# Patient Record
Sex: Female | Born: 1985
Health system: Southern US, Community
[De-identification: ages and names within clinical notes are randomized; demographics above are authoritative.]

## PROBLEM LIST (undated history)

## (undated) ENCOUNTER — Inpatient Hospital Stay (HOSPITAL_COMMUNITY): Payer: Self-pay

## (undated) DIAGNOSIS — O24419 Gestational diabetes mellitus in pregnancy, unspecified control: Secondary | ICD-10-CM

## (undated) DIAGNOSIS — O139 Gestational [pregnancy-induced] hypertension without significant proteinuria, unspecified trimester: Secondary | ICD-10-CM

## (undated) DIAGNOSIS — R87629 Unspecified abnormal cytological findings in specimens from vagina: Secondary | ICD-10-CM

## (undated) HISTORY — DX: Unspecified abnormal cytological findings in specimens from vagina: R87.629

## (undated) HISTORY — PX: NO PAST SURGERIES: SHX2092

---
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
## 2006-08-09 ENCOUNTER — Emergency Department (HOSPITAL_COMMUNITY): Admission: EM | Admit: 2006-08-09 | Discharge: 2006-08-09 | Payer: Self-pay | Admitting: *Deleted

## 2007-06-26 ENCOUNTER — Emergency Department (HOSPITAL_COMMUNITY): Admission: EM | Admit: 2007-06-26 | Discharge: 2007-06-26 | Payer: Self-pay | Admitting: Emergency Medicine

## 2007-09-18 ENCOUNTER — Emergency Department (HOSPITAL_COMMUNITY): Admission: EM | Admit: 2007-09-18 | Discharge: 2007-09-19 | Payer: Self-pay | Admitting: Emergency Medicine

## 2008-11-26 ENCOUNTER — Emergency Department (HOSPITAL_COMMUNITY): Admission: EM | Admit: 2008-11-26 | Discharge: 2008-11-26 | Payer: Self-pay | Admitting: Emergency Medicine

## 2009-04-13 ENCOUNTER — Emergency Department (HOSPITAL_COMMUNITY): Admission: EM | Admit: 2009-04-13 | Discharge: 2009-04-13 | Payer: Self-pay | Admitting: Emergency Medicine

## 2010-06-14 LAB — CBC
Hemoglobin: 12.9 g/dL (ref 12.0–15.0)
MCHC: 34.6 g/dL (ref 30.0–36.0)
MCV: 86.7 fL (ref 78.0–100.0)
RBC: 4.31 MIL/uL (ref 3.87–5.11)
RDW: 12.7 % (ref 11.5–15.5)

## 2010-06-14 LAB — URINALYSIS, ROUTINE W REFLEX MICROSCOPIC
Glucose, UA: NEGATIVE mg/dL
Ketones, ur: NEGATIVE mg/dL
Protein, ur: NEGATIVE mg/dL

## 2010-06-14 LAB — URINE MICROSCOPIC-ADD ON

## 2010-06-14 LAB — BASIC METABOLIC PANEL
Creatinine, Ser: 0.66 mg/dL (ref 0.4–1.2)
GFR calc non Af Amer: >60 mL/min (ref >60)
Potassium: 3.3 mEq/L — ABNORMAL LOW (ref 3.5–5.1)
Sodium: 140 mEq/L (ref 135–145)

## 2010-06-14 LAB — DIFFERENTIAL
Basophils Absolute: 0 10*3/uL (ref 0.0–0.1)
Lymphs Abs: 1 10*3/uL (ref 0.7–4.0)
Monocytes Absolute: 0.2 10*3/uL (ref 0.1–1.0)
Monocytes Relative: 4 % (ref 3–12)

## 2010-10-09 ENCOUNTER — Emergency Department (HOSPITAL_COMMUNITY)
Admission: EM | Admit: 2010-10-09 | Discharge: 2010-10-09 | Disposition: A | Discharge status: Home / Self Care | Payer: Self-pay | Attending: Emergency Medicine | Admitting: Emergency Medicine

## 2010-10-09 DIAGNOSIS — R1013 Epigastric pain: Secondary | ICD-10-CM | POA: Insufficient documentation

## 2010-10-09 DIAGNOSIS — R112 Nausea with vomiting, unspecified: Secondary | ICD-10-CM | POA: Insufficient documentation

## 2010-10-09 DIAGNOSIS — K5289 Other specified noninfective gastroenteritis and colitis: Secondary | ICD-10-CM | POA: Insufficient documentation

## 2010-10-09 DIAGNOSIS — R197 Diarrhea, unspecified: Secondary | ICD-10-CM | POA: Insufficient documentation

## 2010-10-09 DIAGNOSIS — R630 Anorexia: Secondary | ICD-10-CM | POA: Insufficient documentation

## 2010-10-09 LAB — POCT I-STAT, CHEM 8
Calcium, Ion: 1.21 mmol/L (ref 1.12–1.32)
Chloride: 106 mEq/L (ref 96–112)
Hemoglobin: 15 g/dL (ref 12.0–15.0)

## 2010-10-09 LAB — POCT PREGNANCY, URINE: Preg Test, Ur: NEGATIVE

## 2010-12-21 LAB — DIFFERENTIAL
Basophils Relative: 0
Eosinophils Absolute: 0
Eosinophils Relative: 0
Monocytes Absolute: 0.8

## 2010-12-21 LAB — POCT I-STAT, CHEM 8
BUN: 12
Creatinine, Ser: 1.1
Potassium: 3.1 — ABNORMAL LOW

## 2010-12-21 LAB — URINE CULTURE

## 2010-12-21 LAB — CBC
Hemoglobin: 12.4
MCHC: 34.7
MCV: 83.6
RBC: 4.28
RDW: 12.9
WBC: 9.7

## 2010-12-21 LAB — URINALYSIS, ROUTINE W REFLEX MICROSCOPIC
Bilirubin Urine: NEGATIVE
Ketones, ur: 15 — AB
Protein, ur: 100 — AB
Specific Gravity, Urine: 1.023
Urobilinogen, UA: 1
pH: 6

## 2010-12-21 LAB — URINE MICROSCOPIC-ADD ON

## 2010-12-24 LAB — POCT I-STAT, CHEM 8
Calcium, Ion: 1.18
Glucose, Bld: 84
HCT: 40
Hemoglobin: 13.6
Potassium: 3.4 — ABNORMAL LOW
Sodium: 137

## 2010-12-24 LAB — RAPID STREP SCREEN (MED CTR MEBANE ONLY): Streptococcus, Group A Screen (Direct): NEGATIVE

## 2010-12-24 LAB — URINALYSIS, ROUTINE W REFLEX MICROSCOPIC
Glucose, UA: NEGATIVE
Nitrite: NEGATIVE
Specific Gravity, Urine: 1.022
Urobilinogen, UA: 1

## 2010-12-24 LAB — DIFFERENTIAL
Basophils Absolute: 0
Basophils Relative: 0
Lymphocytes Relative: 8 — ABNORMAL LOW

## 2010-12-24 LAB — CBC
HCT: 38.6
MCHC: 33.9
MCV: 85.5
Platelets: 194
RBC: 4.52
RDW: 12.8
WBC: 6.9

## 2012-02-03 ENCOUNTER — Emergency Department (HOSPITAL_COMMUNITY)
Admission: EM | Admit: 2012-02-03 | Discharge: 2012-02-03 | Disposition: A | Discharge status: Home / Self Care | Payer: Medicaid Other | Attending: Emergency Medicine | Admitting: Emergency Medicine

## 2012-02-03 ENCOUNTER — Encounter (HOSPITAL_COMMUNITY): Payer: Self-pay | Admitting: Emergency Medicine

## 2012-02-03 DIAGNOSIS — IMO0001 Reserved for inherently not codable concepts without codable children: Secondary | ICD-10-CM | POA: Insufficient documentation

## 2012-02-03 DIAGNOSIS — J069 Acute upper respiratory infection, unspecified: Secondary | ICD-10-CM | POA: Insufficient documentation

## 2012-02-03 DIAGNOSIS — R110 Nausea: Secondary | ICD-10-CM | POA: Insufficient documentation

## 2012-02-03 DIAGNOSIS — R05 Cough: Secondary | ICD-10-CM | POA: Insufficient documentation

## 2012-02-03 DIAGNOSIS — R509 Fever, unspecified: Secondary | ICD-10-CM | POA: Insufficient documentation

## 2012-02-03 DIAGNOSIS — R059 Cough, unspecified: Secondary | ICD-10-CM | POA: Insufficient documentation

## 2012-02-03 DIAGNOSIS — J3489 Other specified disorders of nose and nasal sinuses: Secondary | ICD-10-CM | POA: Insufficient documentation

## 2012-02-03 MED ORDER — ACETAMINOPHEN 325 MG PO TABS
650.0000 mg | ORAL_TABLET | Freq: Once | ORAL | Status: AC
  Administered 2012-02-03: 650 mg via ORAL
  Filled 2012-02-03: qty 2

## 2012-02-03 NOTE — ED Notes (Signed)
Pt c/o sore throat and fever x 2 days; pt sts chills and body aches

## 2012-02-03 NOTE — ED Provider Notes (Signed)
History   This chart was scribed for Olivia Quarry, MD by Gerlean Ren. This patient was seen in room TR08C/TR08C and the patient's care was started at 2:01 PM    CSN: [id number]  Arrival date & time 02/03/12  1313   None     Chief Complaint  Patient presents with  . Sore Throat  . Fever    The history is provided by the patient. No language interpreter was used.   Olivia Park is a 26 y.o. female who presents to the Emergency Department complaining of 2 days of sore throat with associated HA, fever, chills, productive cough, nausea, rhinorrhea and myalgias.  Pt denies known sick contacts.  Pt denies emesis, abdominal pain, and rash.  Pt has used Dayquil with no improvements.  Pt has no h/o chronic medical conditions.  Pt denies tobacco use but reports occasional alcohol use.   History reviewed. No pertinent past medical history.  History reviewed. No pertinent past surgical history.  History reviewed. No pertinent family history.  History  Substance Use Topics  . Smoking status: Never Smoker   . Smokeless tobacco: Not on file  . Alcohol Use: Yes     Comment: occ    No OB history provided.  Review of Systems  Constitutional: Positive for fever and chills.  HENT: Positive for sore throat.   Musculoskeletal: Positive for myalgias.    Allergies  Review of patient's allergies indicates no known allergies.  Home Medications  No current outpatient prescriptions on file.  BP 136/90  Pulse 122  Temp 100.7 F (38.2 C) (Oral)  Resp 18  SpO2 95%  Physical Exam  Vitals reviewed. Constitutional: She is oriented to person, place, and time. She appears well-developed.  HENT:  Head: Normocephalic and atraumatic.       Pharynx mildly erythematous.  No exudate or swelling.  Eyes: Conjunctivae normal and EOM are normal. Pupils are equal, round, and reactive to light.  Neck: Normal range of motion. No tracheal deviation present.  Cardiovascular: Normal rate, regular  rhythm and normal heart sounds.   Pulmonary/Chest: Effort normal and breath sounds normal.  Musculoskeletal: Normal range of motion. She exhibits no edema.  Neurological: She is alert and oriented to person, place, and time.  Skin: Skin is warm and dry.  Psychiatric: She has a normal mood and affect.    ED Course  Procedures (including critical care time) DIAGNOSTIC STUDIES: Oxygen Saturation is 95% on room air, adequate by my interpretation.    COORDINATION OF CARE: 2:05 PM- Patient informed of clinical course, understands medical decision-making process, and agrees with plan.  Discussed getting a work note for 2 days.  Ordered PO tylenol.      Labs Reviewed - No data to display No results found.   No diagnosis found.   I personally performed the services described in this documentation, which was scribed in my presence. The recorded information has been reviewed and is accurate.   MDM  Patient with presentation of viral pharyngitits with sore throat, body, aches, rhinorrhea , cough.   Initiial vs hr 122, normal on my exam.        Olivia Quarry, MD 02/06/12 [phone]

## 2012-05-18 ENCOUNTER — Emergency Department (HOSPITAL_COMMUNITY)
Admission: EM | Admit: 2012-05-18 | Discharge: 2012-05-18 | Discharge status: Against Medical Advice | Payer: Medicaid Other | Attending: Emergency Medicine | Admitting: Emergency Medicine

## 2012-05-18 ENCOUNTER — Encounter (HOSPITAL_COMMUNITY): Payer: Self-pay | Admitting: Cardiology

## 2012-05-18 DIAGNOSIS — R112 Nausea with vomiting, unspecified: Secondary | ICD-10-CM | POA: Insufficient documentation

## 2012-05-18 DIAGNOSIS — R42 Dizziness and giddiness: Secondary | ICD-10-CM | POA: Insufficient documentation

## 2012-05-18 DIAGNOSIS — Z3202 Encounter for pregnancy test, result negative: Secondary | ICD-10-CM | POA: Insufficient documentation

## 2012-05-18 LAB — URINALYSIS, ROUTINE W REFLEX MICROSCOPIC
Bilirubin Urine: NEGATIVE
Hgb urine dipstick: NEGATIVE
Nitrite: NEGATIVE
Protein, ur: NEGATIVE mg/dL
Specific Gravity, Urine: 1.021 (ref 1.005–1.030)
Urobilinogen, UA: 0.2 mg/dL (ref 0.0–1.0)

## 2012-05-18 LAB — URINE MICROSCOPIC-ADD ON

## 2012-05-18 NOTE — ED Notes (Signed)
No anawer

## 2012-05-18 NOTE — ED Notes (Signed)
Pt reports this morning she had an episode of n/v and has felt dizzy all day. Reports she has not been able to keep anything down. Denies any vision changes or sensitivity to light. Denies any urinary symptoms.

## 2012-05-18 NOTE — ED Notes (Signed)
No answer when called to a room x 2

## 2013-02-24 ENCOUNTER — Emergency Department (HOSPITAL_COMMUNITY)
Admission: EM | Admit: 2013-02-24 | Discharge: 2013-02-24 | Disposition: A | Discharge status: Home / Self Care | Payer: Medicaid Other | Attending: Emergency Medicine | Admitting: Emergency Medicine

## 2013-02-24 ENCOUNTER — Encounter (HOSPITAL_COMMUNITY): Payer: Self-pay | Admitting: Emergency Medicine

## 2013-02-24 DIAGNOSIS — X500XXA Overexertion from strenuous movement or load, initial encounter: Secondary | ICD-10-CM | POA: Insufficient documentation

## 2013-02-24 DIAGNOSIS — S161XXA Strain of muscle, fascia and tendon at neck level, initial encounter: Secondary | ICD-10-CM

## 2013-02-24 DIAGNOSIS — S139XXA Sprain of joints and ligaments of unspecified parts of neck, initial encounter: Secondary | ICD-10-CM | POA: Insufficient documentation

## 2013-02-24 DIAGNOSIS — Y9389 Activity, other specified: Secondary | ICD-10-CM | POA: Insufficient documentation

## 2013-02-24 DIAGNOSIS — Y92009 Unspecified place in unspecified non-institutional (private) residence as the place of occurrence of the external cause: Secondary | ICD-10-CM | POA: Insufficient documentation

## 2013-02-24 MED ORDER — OXYCODONE-ACETAMINOPHEN 5-325 MG PO TABS: 1.0000 | ORAL_TABLET | ORAL | Status: DC | PRN

## 2013-02-24 MED ORDER — DIAZEPAM 5 MG PO TABS
5.0000 mg | ORAL_TABLET | Freq: Once | ORAL | Status: AC
  Administered 2013-02-24: 5 mg via ORAL
  Filled 2013-02-24: qty 1

## 2013-02-24 MED ORDER — DIAZEPAM 5 MG PO TABS: 5.0000 mg | ORAL_TABLET | Freq: Three times a day (TID) | ORAL | Status: DC | PRN

## 2013-02-24 MED ORDER — KETOROLAC TROMETHAMINE 30 MG/ML IJ SOLN
30.0000 mg | Freq: Once | INTRAMUSCULAR | Status: AC
  Administered 2013-02-24: 30 mg via INTRAMUSCULAR
  Filled 2013-02-24: qty 1

## 2013-02-24 MED ORDER — OXYCODONE-ACETAMINOPHEN 5-325 MG PO TABS
2.0000 | ORAL_TABLET | Freq: Once | ORAL | Status: AC
  Administered 2013-02-24: 2 via ORAL
  Filled 2013-02-24: qty 2

## 2013-02-24 MED ORDER — NAPROXEN 375 MG PO TABS: 375.0000 mg | ORAL_TABLET | Freq: Three times a day (TID) | ORAL | Status: DC

## 2013-02-24 NOTE — ED Notes (Signed)
PT reports a sudden onset of Rt sided neck pain . No injuries and no hx of surgery to neck. Pain 10/10. Movement increases pain.

## 2013-02-24 NOTE — ED Provider Notes (Signed)
CSN: [id number]     Arrival date & time 02/24/13  0841 History   First MD Initiated Contact with Patient 02/24/13 [phone]     Chief Complaint  Patient presents with  . Neck Pain   (Consider location/radiation/quality/duration/timing/severity/associated sxs/prior Treatment) HPI  27 year old female with neck pain. Patient first noticed this morning after turning over in bed. She is not specifically sure if she woke up with the pain or if it developed as she turned. Pain is in the right lateral/posterior neck. Has been constant since onset. It is worse with palpation and with turning her head. She tried taking some ibuprofen before arrival with minimal relief. No fevers or chills. No nausea or vomiting. No visual complaints. No numbness or tingling. No dizziness, lightheadedness or vertiginous symptoms. No tinnitus or change in her hearing. Denies any difficulty ambulating or problems with coordination. Pt is otherwise healthy.    History reviewed. No pertinent past medical history. History reviewed. No pertinent past surgical history. No family history on file. History  Substance Use Topics  . Smoking status: Never Smoker   . Smokeless tobacco: Not on file  . Alcohol Use: No   OB History   Grav Para Term Preterm Abortions TAB SAB Ect Mult Living                 Review of Systems  All systems reviewed and negative, other than as noted in HPI.   Allergies  Review of patient's allergies indicates no known allergies.  Home Medications   Current Outpatient Rx  Name  Route  Sig  Dispense  Refill  . ibuprofen (ADVIL,MOTRIN) 200 MG tablet   Oral   Take 200 mg by mouth every 6 (six) hours as needed.         . diazepam (VALIUM) 5 MG tablet   Oral   Take 1 tablet (5 mg total) by mouth every 8 (eight) hours as needed for muscle spasms.   12 tablet   0   . naproxen (NAPROSYN) 375 MG tablet   Oral   Take 1 tablet (375 mg total) by mouth 3 (three) times daily with meals.   20  tablet   0   . oxyCODONE-acetaminophen (PERCOCET/ROXICET) 5-325 MG per tablet   Oral   Take 1 tablet by mouth every 4 (four) hours as needed for severe pain.   12 tablet   0    BP 123/76  Pulse 84  Temp(Src) 98.2 F (36.8 C) (Oral)  Resp 16  SpO2 100% Physical Exam  Nursing note and vitals reviewed. Constitutional: She appears well-developed and well-nourished. No distress.  Patient sitting in bed with an ice pack held against the right side of her neck. Mildly uncomfortable appearing, but not toxic.  HENT:  Head: Normocephalic and atraumatic.  Head and neck are normal in appearance. No swelling or concerning skin lesions noted. Patient with tenderness to palpation along the right paraspinal muscles. No midline spinal tenderness. Grimacing and in pain with neck rotation. Anterior neck nontender. No nodes. No stridor. Posterior pharynx clear. Handling secretions. Normal sounding phonation.   Eyes: Conjunctivae are normal. Right eye exhibits no discharge. Left eye exhibits no discharge.  Neck: Neck supple.  Cardiovascular: Normal rate, regular rhythm and normal heart sounds.  Exam reveals no gallop and no friction rub.   No murmur heard. Pulmonary/Chest: Effort normal and breath sounds normal. No respiratory distress.  Abdominal: Soft. She exhibits no distension. There is no tenderness.  Musculoskeletal: She exhibits no edema  and no tenderness.  Neurological: She is alert. No cranial nerve deficit. She exhibits normal muscle tone. Coordination normal.  No ptosis. Extraocular muscle function is intact. Orbital tissues unremarkable. Eye exam is unremarkable as well. Normal sounding phonation. Sensation intact to light touch. Good finger to nose b/l.   Skin: Skin is warm and dry.  Psychiatric: She has a normal mood and affect. Her behavior is normal. Thought content normal.    ED Course  Procedures (including critical care time) Labs Review Labs Reviewed - No data to  display Imaging Review No results found.  EKG Interpretation   None       MDM   1. Neck strain, initial encounter    27 year old female with right-sided neck pain. Suspect muscular strain. Consider potentially emergent etiology such as vertebral artery dissection. This can conceivably happen with fairly innocuous trauma or turning of the head. My suspicion for this is low though. Patient is exquisitely tender over her right cervical paraspinal muscles. Significantly worsen with head/neck movement. Nonfocal neurological examination. No neurological complaints. Doubt meningitis. Plan symptomatic treatment at this time. Return precautions were discussed including need for immediate re-evaluation of develops any neurological symptoms. Outpt FU as needed otherwise.     Raeford Razor, MD 02/24/13 1004

## 2013-03-07 ENCOUNTER — Emergency Department (HOSPITAL_COMMUNITY): Payer: Medicaid Other

## 2013-03-07 ENCOUNTER — Encounter (HOSPITAL_COMMUNITY): Payer: Self-pay | Admitting: Emergency Medicine

## 2013-03-07 ENCOUNTER — Emergency Department (HOSPITAL_COMMUNITY)
Admission: EM | Admit: 2013-03-07 | Discharge: 2013-03-07 | Disposition: A | Discharge status: Home / Self Care | Payer: Medicaid Other | Attending: Emergency Medicine | Admitting: Emergency Medicine

## 2013-03-07 DIAGNOSIS — M779 Enthesopathy, unspecified: Secondary | ICD-10-CM

## 2013-03-07 DIAGNOSIS — M65839 Other synovitis and tenosynovitis, unspecified forearm: Secondary | ICD-10-CM | POA: Insufficient documentation

## 2013-03-07 MED ORDER — IBUPROFEN 800 MG PO TABS: 800.0000 mg | ORAL_TABLET | Freq: Three times a day (TID) | ORAL | Status: DC | PRN

## 2013-03-07 NOTE — ED Provider Notes (Signed)
CSN: [id number]     Arrival date & time 03/07/13  0802 History   First MD Initiated Contact with Patient 03/07/13 [phone]     Chief Complaint  Patient presents with  . Hand Injury   (Consider location/radiation/quality/duration/timing/severity/associated sxs/prior Treatment) HPI Patient is a 27 yo female with no significant PMH who presents to the Emergency Department complaining of right wrist and hand pain and swelling since yesterday. 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 She reports that the pain started while she was stapling and has been constant since. Patient states that the pain is a throbbing pain, rates it as a 9/10 in severity, and it does not radiate. Patient denies any numbness or tingling in that hand. She reports decreased range of motion due to the pain and swelling. Patient did not take anything for the pain prior to arrival. She denies this every happening before.   History reviewed. No pertinent past medical history. History reviewed. No pertinent past surgical history. History reviewed. No pertinent family history. History  Substance Use Topics  . Smoking status: Never Smoker   . Smokeless tobacco: Not on file  . Alcohol Use: No   OB History   Grav Para Term Preterm Abortions TAB SAB Ect Mult Living                 Review of Systems  Allergies  Review of patient's allergies indicates no known allergies.  Home Medications  No current outpatient prescriptions on file. BP 123/82  Pulse 79  Temp(Src) 98.8 F (37.1 C) (Oral)  Resp 16  SpO2 100% Physical Exam  Constitutional: She is oriented to person, place, and time. She appears well-developed and well-nourished. No distress.  Musculoskeletal:       Right wrist: She exhibits tenderness. She exhibits normal range of motion, no bony tenderness, no swelling and no deformity.       Right hand: She exhibits decreased range of motion, tenderness and swelling.  She exhibits no bony tenderness, no deformity and no laceration. Normal sensation noted. Normal strength noted.       Hands: Finkelstein's test positive.   Neurological: She is alert and oriented to person, place, and time.  Skin: Skin is warm and dry.    ED Course  Procedures (including critical care time) Labs Review Labs Reviewed - No data to display Imaging Review Dg Hand Complete Right  03/07/2013   CLINICAL DATA:  Hand injury and pain.  Repetitive motion.  EXAM: RIGHT HAND - COMPLETE 3+ VIEW  COMPARISON:  None.  FINDINGS: No acute bone or soft tissue abnormality is present. There is no radiopaque foreign body. The wrist is located.  IMPRESSION: Negative right hand radiographs.   Electronically Signed   By: Gennette Pac M.D.   On: 03/07/2013 08:42     Referred patient to on-call hand Dr. Provided Velcro splint for thumb. Encouraged patient to alternate heat and ice on her wrist and thenar prominence. Discussed plan with patient and patient voiced understanding.   Carlyle Dolly, PA-C 03/07/13 1609

## 2013-03-07 NOTE — ED Notes (Signed)
Ortho tech called. Pt will be receiving a thumb spica.

## 2013-03-07 NOTE — ED Notes (Signed)
  Cap refill present in all fingers of right hand. Radial pulses palpable. Right hand does not appear swollen in comparison to left hand. Pt is unable to full extend fingers. Pt complains  Of 9/10 pain. 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

## 2013-03-07 NOTE — ED Notes (Signed)
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 Pt states she is unable to fully extend her Right hand, good sensation and pulses

## 2013-03-07 NOTE — Progress Notes (Signed)
Orthopedic Tech Progress Note Patient Details:  Olivia Park Nov 15, 1985 [id number]  Ortho Devices Type of Ortho Device: Thumb spica splint Ortho Device/Splint Interventions: Application   Shawnie Pons 03/07/2013, 9:39 AM

## 2013-03-08 NOTE — ED Provider Notes (Signed)
Medical screening examination/treatment/procedure(s) were performed by non-physician practitioner and as supervising physician I was immediately available for consultation/collaboration.   Nelia Shi, MD 03/08/13 [phone]

## 2014-05-15 ENCOUNTER — Encounter (HOSPITAL_COMMUNITY): Payer: Self-pay | Admitting: *Deleted

## 2014-05-15 ENCOUNTER — Emergency Department (HOSPITAL_COMMUNITY)
Admission: EM | Admit: 2014-05-15 | Discharge: 2014-05-15 | Disposition: A | Discharge status: Home / Self Care | Payer: Medicaid Other | Attending: Emergency Medicine | Admitting: Emergency Medicine

## 2014-05-15 DIAGNOSIS — K088 Other specified disorders of teeth and supporting structures: Secondary | ICD-10-CM | POA: Diagnosis present

## 2014-05-15 DIAGNOSIS — K029 Dental caries, unspecified: Secondary | ICD-10-CM | POA: Insufficient documentation

## 2014-05-15 MED ORDER — AMOXICILLIN 500 MG PO CAPS
500.0000 mg | ORAL_CAPSULE | Freq: Once | ORAL | Status: AC
  Administered 2014-05-15: 500 mg via ORAL
  Filled 2014-05-15: qty 1

## 2014-05-15 MED ORDER — AMOXICILLIN 500 MG PO CAPS
500.0000 mg | ORAL_CAPSULE | Freq: Three times a day (TID) | ORAL | Status: DC
Start: 2014-05-15 — End: 2015-06-13

## 2014-05-15 MED ORDER — HYDROCODONE-ACETAMINOPHEN 5-325 MG PO TABS
1.0000 | ORAL_TABLET | Freq: Once | ORAL | Status: AC
  Administered 2014-05-15: 1 via ORAL
  Filled 2014-05-15: qty 1

## 2014-05-15 MED ORDER — HYDROCODONE-ACETAMINOPHEN 5-325 MG PO TABS: ORAL_TABLET | ORAL | Status: DC

## 2014-05-15 NOTE — ED Provider Notes (Signed)
CSN: [id number]     Arrival date & time 05/15/14  1703 History  This chart was scribed for Wynetta EmeryNicole Shantanique Hodo, PA-C, working with Glynn OctaveStephen Rancour, MD by Chestine SporeSoijett Blue, ED Scribe. The patient was seen in room TR04C/TR04C at 5:28 PM.    Chief Complaint  Patient presents with  . Dental Pain     The history is provided by the patient. No language interpreter was used.     HPI Comments: Olivia Park is a 29 y.o. female who presents to the Emergency Department complaining of right upper dental pain onset couple months. She notes that the pain worsened yesterday when she bit into something. She rates her pain as 10/10. She denies any other symptoms. She denies being allergic to any medications. She reports that she has a dental appointment on Monday.   History reviewed. No pertinent past medical history. History reviewed. No pertinent past surgical history. History reviewed. No pertinent family history. History  Substance Use Topics  . Smoking status: Never Smoker   . Smokeless tobacco: Not on file  . Alcohol Use: No   OB History    No data available     Review of Systems  A complete 10 system review of systems was obtained and all systems are negative except as noted in the HPI and PMH.     Allergies  Review of patient's allergies indicates no known allergies.  Home Medications   Prior to Admission medications   Medication Sig Start Date End Date Taking? Authorizing Provider  ibuprofen (ADVIL,MOTRIN) 800 MG tablet Take 1 tablet (800 mg total) by mouth every 8 (eight) hours as needed. 03/07/13   Jamesetta Orleanshristopher W Lawyer, PA-C   BP 121/78 mmHg  Pulse 87  Temp(Src) 98.6 F (37 C) (Oral)  Resp 16  SpO2 99%  Physical Exam  Constitutional: She is oriented to person, place, and time. She appears well-developed and well-nourished. No distress.  HENT:  Head: Normocephalic and atraumatic.  Mouth/Throat: Uvula is midline and oropharynx is clear and moist. No trismus in the jaw. No  uvula swelling. No oropharyngeal exudate, posterior oropharyngeal edema, posterior oropharyngeal erythema or tonsillar abscesses.  Generally poor dentition, no gingival swelling, erythema or tenderness to palpation. Patient is handling their secretions. There is no tenderness to palpation or firmness underneath tongue bilaterally. No trismus.    Eyes: Conjunctivae and EOM are normal.  Neck: Neck supple. No tracheal deviation present.  Cardiovascular: Normal rate.   Pulmonary/Chest: Effort normal. No stridor. No respiratory distress.  Musculoskeletal: Normal range of motion.  Lymphadenopathy:    She has no cervical adenopathy.  Neurological: She is alert and oriented to person, place, and time.  Skin: Skin is warm and dry.  Psychiatric: She has a normal mood and affect. Her behavior is normal.  Nursing note and vitals reviewed.   ED Course  Procedures (including critical care time) DIAGNOSTIC STUDIES: Oxygen Saturation is 99% on room air, normal by my interpretation.    COORDINATION OF CARE: 5:30 PM-Discussed treatment plan which includes amoxicillin and Norco Rx with pt at bedside and pt agreed to plan.  Labs Review Labs Reviewed - No data to display  Imaging Review No results found.   EKG Interpretation None      MDM   Final diagnoses:  Pain due to dental caries   Filed Vitals:   05/15/14 1718  BP: 121/78  Pulse: 87  Temp: 98.6 F (37 C)  TempSrc: Oral  Resp: 16  SpO2: 99%  Medications  amoxicillin (AMOXIL) capsule 500 mg (not administered)  HYDROcodone-acetaminophen (NORCO/VICODIN) 5-325 MG per tablet 1 tablet (not administered)    Olivia Plume is a pleasant 28 y.o. female presenting with dental pain associated with dental caries but no signs or symptoms of dental abscess. Patient afebrile, non toxic appearing and swallowing secretions well. I gave patient referral to dentist and stressed the importance of dental follow up for definitive management of  dental issues. Patient voices understanding and is agreeable to plan.  Evaluation does not show pathology that would require ongoing emergent intervention or inpatient treatment. Pt is hemodynamically stable and mentating appropriately. Discussed findings and plan with patient/guardian, who agrees with care plan. All questions answered. Return precautions discussed and outpatient follow up given.   New Prescriptions   AMOXICILLIN (AMOXIL) 500 MG CAPSULE    Take 1 capsule (500 mg total) by mouth 3 (three) times daily.   HYDROCODONE-ACETAMINOPHEN (NORCO/VICODIN) 5-325 MG PER TABLET    Take 1-2 tablets by mouth every 6 hours as needed for pain.     I personally performed the services described in this documentation, which was scribed in my presence. The recorded information has been reviewed and is accurate.    Wynetta Emery, PA-C 05/16/14 0206  Glynn Octave, MD 05/16/14 [phone]

## 2014-05-15 NOTE — ED Notes (Signed)
Pt c/o R upper molar pain for months. Reports pain worsened yesterday after biting into something and "it felt like it hit a nerve"

## 2014-05-15 NOTE — Discharge Instructions (Signed)
Take percocet for breakthrough pain, do not drink alcohol, drive, care for children or do other critical tasks while taking percocet.  Return to the emergency room for fever, change in vision, redness to the face that rapidly spreads towards the eye, nausea or vomiting, difficulty swallowing or shortness of breath.   Apply warm compresses to jaw throughout the day.   Take your antibiotics as directed and to completion. You should never have any leftover antibiotics! Push fluids and stay well hydrated.   Any antibiotic use can reduce the efficacy of hormonal birth control. Please use back up method of contraception.   Followup with a dentist is very important for ongoing evaluation and management of recurrent dental pain. Return to emergency department for emergent changing or worsening symptoms."  Low-cost dental clinic: [facility]  at [phone]**  **[facility] at [phone] [address]**    You may also call [phone]  Dental Caries Dental caries (also called tooth decay) is the most common oral disease. It can occur at any age but is more common in children and young adults.  HOW DENTAL CARIES DEVELOPS  The process of decay begins when bacteria and foods (particularly sugars and starches) combine in your mouth to produce plaque. Plaque is a substance that sticks to the hard, outer surface of a tooth (enamel). The bacteria in plaque produce acids that attack enamel. These acids may also attack the root surface of a tooth (cementum) if it is exposed. Repeated attacks dissolve these surfaces and create holes in the tooth (cavities). If left untreated, the acids destroy the other layers of the tooth.  RISK FACTORS  Frequent sipping of sugary beverages.   Frequent snacking on sugary and starchy foods, especially those that easily get stuck in the teeth.   Poor oral hygiene.   Dry mouth.   Substance abuse such as methamphetamine abuse.   Broken or  poor-fitting dental restorations.   Eating disorders.   Gastroesophageal reflux disease (GERD).   Certain radiation treatments to the head and neck. SYMPTOMS In the early stages of dental caries, symptoms are seldom present. Sometimes white, chalky areas may be seen on the enamel or other tooth layers. In later stages, symptoms may include:  Pits and holes on the enamel.  Toothache after sweet, hot, or cold foods or drinks are consumed.  Pain around the tooth.  Swelling around the tooth. DIAGNOSIS  Most of the time, dental caries is detected during a regular dental checkup. A diagnosis is made after a thorough medical and dental history is taken and the surfaces of your teeth are checked for signs of dental caries. Sometimes special instruments, such as lasers, are used to check for dental caries. Dental X-ray exams may be taken so that areas not visible to the eye (such as between the contact areas of the teeth) can be checked for cavities.  TREATMENT  If dental caries is in its early stages, it may be reversed with a fluoride treatment or an application of a remineralizing agent at the dental office. Thorough brushing and flossing at home is needed to aid these treatments. If it is in its later stages, treatment depends on the location and extent of tooth destruction:   If a small area of the tooth has been destroyed, the destroyed area will be removed and cavities will be filled with a material such as gold, silver amalgam, or composite resin.   If a large area of the tooth has been destroyed, the  destroyed area will be removed and a cap (crown) will be fitted over the remaining tooth structure.   If the center part of the tooth (pulp) is affected, a procedure called a root canal will be needed before a filling or crown can be placed.   If most of the tooth has been destroyed, the tooth may need to be pulled (extracted). HOME CARE INSTRUCTIONS You can prevent, stop, or reverse  dental caries at home by practicing good oral hygiene. Good oral hygiene includes:  Thoroughly cleaning your teeth at least twice a day with a toothbrush and dental floss.   Using a fluoride toothpaste. A fluoride mouth rinse may also be used if recommended by your dentist or health care provider.   Restricting the amount of sugary and starchy foods and sugary liquids you consume.   Avoiding frequent snacking on these foods and sipping of these liquids.   Keeping regular visits with a dentist for checkups and cleanings. PREVENTION   Practice good oral hygiene.  Consider a dental sealant. A dental sealant is a coating material that is applied by your dentist to the pits and grooves of teeth. The sealant prevents food from being trapped in them. It may protect the teeth for several years.  Ask about fluoride supplements if you live in a community without fluorinated water or with water that has a low fluoride content. Use fluoride supplements as directed by your dentist or health care provider.  Allow fluoride varnish applications to teeth if directed by your dentist or health care provider. Document Released: 12/05/2001 Document Revised: 07/30/2013 Document Reviewed: 03/17/2012 [facility] Patient Information 2015 [place]. This information is not intended to replace advice given to you by your health care provider. Make sure you discuss any questions you have with your health care provider.  Dental Assistance If the dentist on-call cannot see you, please use the resources below:   Patients with Medicaid: [facility] [phone], [phone] [address], [phone]  If unable to pay, or uninsured, contact [facility] [phone]) or [facility] [phone] in [place], [phone] in [facility]) to become qualified for the adult dental clinic  Other Low-Cost Community Dental Services: [facility]- [address],  [place], [place], [address]    [phone], Ext. [phone]    2nd and 4th Thursday of the month at 6:30am    10 clients each day by appointment, can sometimes see walk-in     patients if someone does not show for an appointment [facility]- [address], [place], [place], [address]    [phone] [facility]- [address], [place], [place], [address]    [phone]  [facility]- [phone] [facility]- [phone] [facility]- [phone]

## 2015-06-13 ENCOUNTER — Emergency Department (HOSPITAL_COMMUNITY)
Admission: EM | Admit: 2015-06-13 | Discharge: 2015-06-13 | Disposition: A | Discharge status: Home / Self Care | Payer: Medicaid Other | Attending: Emergency Medicine | Admitting: Emergency Medicine

## 2015-06-13 ENCOUNTER — Encounter (HOSPITAL_COMMUNITY): Payer: Self-pay | Admitting: Emergency Medicine

## 2015-06-13 DIAGNOSIS — J111 Influenza due to unidentified influenza virus with other respiratory manifestations: Secondary | ICD-10-CM

## 2015-06-13 MED ORDER — HYDROCODONE-HOMATROPINE 5-1.5 MG/5ML PO SYRP: 5.0000 mL | ORAL_SOLUTION | Freq: Four times a day (QID) | ORAL | Status: DC | PRN

## 2015-06-13 NOTE — ED Notes (Signed)
Pt presents to eD for flu-like symptoms.  Pt c/o generalized body aches, head ache, congestion, sore throat and fever.

## 2015-06-13 NOTE — ED Notes (Signed)
Patient able to ambulate independently  

## 2015-06-13 NOTE — ED Provider Notes (Signed)
CSN: [id number]     Arrival date & time 06/13/15  1844 History  By signing my name below, I, Olivia Park, attest that this documentation has been prepared under the direction and in the presence of Avnetobert Julies Carmickle PA-C. Electronically Signed: Bethel BornBritney Park, ED Scribe. 06/13/2015 7:21 PM   Chief Complaint  Patient presents with  . URI   The history is provided by the patient. No language interpreter was used.   Olivia Park is a 30 y.o. female who presents to the Emergency Department complaining of flu-like symptoms with onset last week. Associated symptoms include temperature up to 100 F, nasal congestion, dry cough, sore throat, and myalgias. Theraflu has provided insufficient relief in symptoms at home. Pt denies n/v/d. She states that she is not pregnant or breat feeding.  History reviewed. No pertinent past medical history. History reviewed. No pertinent past surgical history. History reviewed. No pertinent family history. Social History  Substance Use Topics  . Smoking status: Never Smoker   . Smokeless tobacco: None  . Alcohol Use: No   OB History    No data available     Review of Systems  Constitutional: Fever: temperature up to 100 F.  HENT: Positive for congestion and sore throat.   Respiratory: Positive for cough.   Musculoskeletal: Positive for myalgias.   Allergies  Review of patient's allergies indicates no known allergies.  Home Medications   Prior to Admission medications   Medication Sig Start Date End Date Taking? Authorizing Provider  HYDROcodone-homatropine (HYCODAN) 5-1.5 MG/5ML syrup Take 5 mLs by mouth every 6 (six) hours as needed for cough. 06/13/15   Roxy Horsemanobert Yandriel Boening, PA-C   BP 143/92 mmHg  Pulse 98  Temp(Src) 99 F (37.2 C) (Oral)  Resp 17  Ht 5\' 4"  (1.626 m)  Wt 116 lb (52.617 kg)  BMI 19.90 kg/m2  SpO2 100% Physical Exam Physical Exam  Constitutional: Pt  is oriented to person, place, and time. Appears well-developed and  well-nourished. No distress.  HENT:  Head: Normocephalic and atraumatic.  Right Ear: Tympanic membrane, external ear and ear canal normal.  Left Ear: Tympanic membrane, external ear and ear canal normal.  Nose: Mucosal edema and moderate rhinorrhea present. No epistaxis. Right sinus exhibits no maxillary sinus tenderness and no frontal sinus tenderness. Left sinus exhibits no maxillary sinus tenderness and no frontal sinus tenderness.  Mouth/Throat: Uvula is midline and mucous membranes are normal. Mucous membranes are not pale and not cyanotic. No oropharyngeal exudate, posterior oropharyngeal edema, posterior oropharyngeal erythema or tonsillar abscesses.  Eyes: Conjunctivae are normal. Pupils are equal, round, and reactive to light.  Neck: Normal range of motion and full passive range of motion without pain.  Cardiovascular: Normal rate and intact distal pulses.   Pulmonary/Chest: Effort normal and breath sounds normal. No stridor.  Clear and equal breath sounds without focal wheezes, rhonchi, rales  Abdominal: Soft. Bowel sounds are normal. There is no tenderness.  Musculoskeletal: Normal range of motion.  Lymphadenopathy:    Pthas no cervical adenopathy.  Neurological: Pt is alert and oriented to person, place, and time.  Skin: Skin is warm and dry. No rash noted. Pt is not diaphoretic.  Psychiatric: Normal mood and affect.  Nursing note and vitals reviewed.   ED Course  Procedures (including critical care time) DIAGNOSTIC STUDIES: Oxygen Saturation is 100% on RA,  normal by my interpretation.    COORDINATION OF CARE: 7:18 PM Discussed treatment plan which includes discharge with symptomatic treatment with pt at bedside  and pt agreed to plan.    MDM   Final diagnoses:  Influenza    Patient with symptoms consistent with influenza.  Vitals are stable, low-grade fever.  No signs of dehydration, tolerating PO's.  Lungs are clear. Due to patient's presentation and physical exam  a chest x-ray was not ordered bc likely diagnosis of flu.   The patient understands that symptoms are greater than the recommended 24-48 hour window of treatment.  Patient will be discharged with instructions to orally hydrate, rest, and use over-the-counter medications such as anti-inflammatories ibuprofen and Aleve for muscle aches and Tylenol for fever.  Patient will also be given a cough suppressant.    I personally performed the services described in this documentation, which was scribed in my presence. The recorded information has been reviewed and is accurate.      Roxy Horseman, PA-C 06/13/15 1925  Raeford Razor, MD 06/14/15 [phone]

## 2015-06-13 NOTE — Discharge Instructions (Signed)

## 2016-11-01 ENCOUNTER — Emergency Department (HOSPITAL_COMMUNITY)
Admission: EM | Admit: 2016-11-01 | Discharge: 2016-11-01 | Disposition: A | Discharge status: Home / Self Care | Payer: Medicaid Other | Attending: Emergency Medicine | Admitting: Emergency Medicine

## 2016-11-01 ENCOUNTER — Encounter (HOSPITAL_COMMUNITY): Payer: Self-pay | Admitting: Emergency Medicine

## 2016-11-01 DIAGNOSIS — J020 Streptococcal pharyngitis: Secondary | ICD-10-CM

## 2016-11-01 DIAGNOSIS — Z79899 Other long term (current) drug therapy: Secondary | ICD-10-CM | POA: Insufficient documentation

## 2016-11-01 LAB — RAPID STREP SCREEN (MED CTR MEBANE ONLY): STREPTOCOCCUS, GROUP A SCREEN (DIRECT): NEGATIVE

## 2016-11-01 MED ORDER — DEXAMETHASONE SODIUM PHOSPHATE 10 MG/ML IJ SOLN
10.0000 mg | Freq: Once | INTRAMUSCULAR | Status: AC
  Administered 2016-11-01: 10 mg via INTRAMUSCULAR
  Filled 2016-11-01: qty 1

## 2016-11-01 MED ORDER — PENICILLIN G BENZATHINE 1200000 UNIT/2ML IM SUSP
1.2000 10*6.[IU] | Freq: Once | INTRAMUSCULAR | Status: AC
  Administered 2016-11-01: 1.2 10*6.[IU] via INTRAMUSCULAR
  Filled 2016-11-01: qty 2

## 2016-11-01 MED ORDER — ACETAMINOPHEN 325 MG PO TABS
650.0000 mg | ORAL_TABLET | Freq: Once | ORAL | Status: AC
  Administered 2016-11-01: 650 mg via ORAL
  Filled 2016-11-01: qty 2

## 2016-11-01 NOTE — ED Provider Notes (Signed)
[facility]-EMERGENCY DEPT Provider Note   CSN: [id number] Arrival date & time: 11/01/16  [id number]  By signing my name below, I, Olivia Park, attest that this documentation has been prepared under the direction and in the presence of [facility] Independence[facility]ina Jailon Schaible PA-C.  Electronically Signed: Vista Minkobert Park, ED Scribe. 11/01/16. 9:17 AM.   History   Chief Complaint Chief Complaint  Patient presents with  . Fever  . Sore Throat    HPI HPI Comments: Olivia Park is a 31 y.o. female who presents to the Emergency Department complaining of Acute onset, progressively worsening sore throat and fevers since yesterday morning. Patient endorses difficulty swallowing, mild cough, and radiation of sharp pain to the right ear. Has not tried anything for her symptoms. No alleviating factors noted. States she is able to eat and drink but it is very painful. Denies shortness of breath, chest pain, abdominal pain, nausea, vomiting, or diarrhea. No nasal congestion.   The history is provided by the patient. No language interpreter was used.    History reviewed. No pertinent past medical history.  There are no active problems to display for this patient.   History reviewed. No pertinent surgical history.  OB History    No data available       Home Medications    Prior to Admission medications   Medication Sig Start Date End Date Taking? Authorizing Provider  HYDROcodone-homatropine (HYCODAN) 5-1.5 MG/5ML syrup Take 5 mLs by mouth every 6 (six) hours as needed for cough. 06/13/15   Olivia HorsemanBrowning, Robert, PA-C    Family History No family history on file.  Social History Social History  Substance Use Topics  . Smoking status: Never Smoker  . Smokeless tobacco: Never Used  . Alcohol use No     Comment: socially     Allergies   Patient has no known allergies.   Review of Systems Review of Systems  Constitutional: Positive for chills. Negative for fever (99.4 here today).  HENT: Positive for ear pain  (radiating to R ear) and sore throat. Negative for congestion, drooling, rhinorrhea, sinus pain, trouble swallowing and voice change.   Respiratory: Negative for cough, shortness of breath and wheezing.   Gastrointestinal: Negative for abdominal pain, diarrhea, nausea and vomiting.     Physical Exam Updated Vital Signs BP (!) 135/104   Pulse (!) 104   Temp 99.4 F (37.4 C) (Oral)   Resp 16   Ht 5\' 5"  (1.651 m)   Wt 96.2 kg (212 lb)   LMP 10/25/2016   SpO2 95%   BMI 35.28 kg/m   Physical Exam  Constitutional: She appears well-developed and well-nourished. No distress.  Resting comfortably in chair  HENT:  Head: Normocephalic and atraumatic.  Mouth/Throat: Oropharyngeal exudate present.  TM's normal bilaterally. No erythema. No bulging. No frontal or maxillary sinus tenderness to palpation. Nasal septum midline with pink mucosa. Posterior oropharynx with tonsillar erythema, hypertrophy, exudate. No uvular deviation. No sublingual abnormalities or trismus. No facial swelling.   Eyes: Conjunctivae are normal. Right eye exhibits no discharge. Left eye exhibits no discharge.  Neck: No JVD present. No tracheal deviation present.  R anterior LAD  Cardiovascular: Regular rhythm and normal heart sounds.  Exam reveals no gallop and no friction rub.   No murmur heard. Pulmonary/Chest: Effort normal and breath sounds normal. No respiratory distress. She has no wheezes. She has no rales.  Lungs CTA bilaterally.  Abdominal: She exhibits no distension.  Musculoskeletal: She exhibits no edema.  Lymphadenopathy:    She  has cervical adenopathy.  Neurological: She is alert.  Skin: Skin is warm and dry. No erythema.  Psychiatric: She has a normal mood and affect. Her behavior is normal.  Nursing note and vitals reviewed.    ED Treatments / Results  DIAGNOSTIC STUDIES: Oxygen Saturation is 95% on RA, normal by my interpretation.  COORDINATION OF CARE: 9:17 AM-Discussed treatment plan with  pt at bedside and pt agreed to plan.   Labs (all labs ordered are listed, but only abnormal results are displayed) Labs Reviewed  RAPID STREP SCREEN (NOT AT [facility])    EKG  EKG Interpretation None       Radiology No results found.  Procedures Procedures (including critical care time)  Medications Ordered in ED Medications  acetaminophen (TYLENOL) tablet 650 mg (not administered)  penicillin g benzathine (BICILLIN LA) 1200000 UNIT/2ML injection 1.2 Million Units (not administered)  dexamethasone (DECADRON) injection 10 mg (not administered)     Initial Impression / Assessment and Plan / ED Course  I have reviewed the triage vital signs and the nursing notes.  Pertinent labs & imaging results that were available during my care of the patient were reviewed by me and considered in my medical decision making (see chart for details).     Pt with tactile fevers PTA with tonsillar exudate, cervical lymphadenopathy, & dysphagia; diagnosis of strep. Treated in the ED with steroids, Pain medication and PCN IM.  Pt appears mildly dehydrated, discussed importance of water rehydration. Presentation non concerning for PTA or infxn spread to soft tissue. No trismus or uvula deviation. Specific return precautions discussed. Pt able to drink water in ED without difficulty with intact air way. Recommended PCP follow up. Pt verbalized understanding of and agreement with plan and is safe for discharge home at this time.    Final Clinical Impressions(s) / ED Diagnoses   Final diagnoses:  Strep pharyngitis    New Prescriptions New Prescriptions   No medications on file  I personally performed the services described in this documentation, which was scribed in my presence. The recorded information has been reviewed and is accurate.     Olivia Sewer, PA-C 11/01/16 1191    Olivia Bale, MD 11/01/16 [phone]

## 2016-11-01 NOTE — Discharge Instructions (Signed)
You have been treated for strep throat here. Take ibuprofen or Tylenol as needed for fevers and pain. Use warm water salt gargles, warm teas, over-the-counter throat lozenges or sprays for comfort. Drink plenty of fluids and get plenty of rest. Follow up with a primary care physician is symptoms persist. Return to the ED immediately if any concerning signs or symptoms develop such as difficulty breathing, throat tightness, facial swelling, or worsening symptoms.

## 2016-11-01 NOTE — ED Triage Notes (Signed)
C/o sore throat that started yesterday.

## 2016-11-03 LAB — CULTURE, GROUP A STREP (THRC)

## 2017-03-15 ENCOUNTER — Telehealth: Payer: Self-pay | Admitting: Advanced Practice Midwife

## 2017-03-15 ENCOUNTER — Other Ambulatory Visit: Payer: Self-pay | Admitting: Advanced Practice Midwife

## 2017-03-15 DIAGNOSIS — Z331 Pregnant state, incidental: Secondary | ICD-10-CM

## 2017-03-15 MED ORDER — CONCEPT OB 130-92.4-1 MG PO CAPS: 1.0000 | ORAL_CAPSULE | Freq: Every day | ORAL | 12 refills | Status: DC

## 2017-03-15 NOTE — Telephone Encounter (Signed)
Requesting Rx PNV. [facility] sent to pharmacy.   Katrinka BlazingSmith, [place] 03/15/2017 4:43 PM

## 2017-03-15 NOTE — Progress Notes (Signed)
Error

## 2017-03-29 NOTE — L&D Delivery Note (Signed)
Patient is 32 y.o. G2P1001 [id number] admitted to L&D with active labor. Progressed without augmentation. SROM at 0200.  Prenatal course also complicated by A1GDM, preeclampsia without severe features.  Delivery Note At 7:19 AM a viable female was delivered via Vaginal, Spontaneous (Presentation: ROA).  APGAR: 8, 9; weight pending.  Placenta status: Intact.  Cord: 3V with the following complications: Nuchal x1, loose.  Cord pH: N/A  Anesthesia: None  Episiotomy: None Lacerations: None Est. Blood Loss (mL): 100  Mom to postpartum.  Baby to Couplet care / Skin to Skin.  Caryl AdaJazma Phelps, DO 09/22/2017, 7:35 AM OB Fellow Center for [facility], [facility]

## 2017-04-07 ENCOUNTER — Encounter (HOSPITAL_COMMUNITY): Payer: Self-pay | Admitting: Emergency Medicine

## 2017-04-07 ENCOUNTER — Ambulatory Visit (HOSPITAL_COMMUNITY)
Admission: EM | Admit: 2017-04-07 | Discharge: 2017-04-07 | Disposition: A | Discharge status: Home / Self Care | Payer: Medicaid Other | Attending: Family Medicine | Admitting: Family Medicine

## 2017-04-07 DIAGNOSIS — M545 Low back pain: Secondary | ICD-10-CM

## 2017-04-07 DIAGNOSIS — Z3201 Encounter for pregnancy test, result positive: Secondary | ICD-10-CM | POA: Diagnosis not present

## 2017-04-07 DIAGNOSIS — R110 Nausea: Secondary | ICD-10-CM | POA: Diagnosis not present

## 2017-04-07 LAB — POCT PREGNANCY, URINE
Preg Test, Ur: NEGATIVE
Preg Test, Ur: POSITIVE — AB

## 2017-04-07 LAB — POCT URINALYSIS DIP (DEVICE)
Bilirubin Urine: NEGATIVE
Glucose, UA: NEGATIVE mg/dL
HGB URINE DIPSTICK: NEGATIVE
Ketones, ur: 15 mg/dL — AB
Leukocytes, UA: NEGATIVE
NITRITE: NEGATIVE
PROTEIN: NEGATIVE mg/dL
SPECIFIC GRAVITY, URINE: 1.025 (ref 1.005–1.030)
UROBILINOGEN UA: 0.2 mg/dL (ref 0.0–1.0)
pH: 7 (ref 5.0–8.0)

## 2017-04-07 MED ORDER — PROMETHAZINE HCL 25 MG PO TABS: 25.0000 mg | ORAL_TABLET | Freq: Four times a day (QID) | ORAL | 0 refills | Status: DC | PRN

## 2017-04-07 NOTE — ED Notes (Signed)
Spoke with Blase Messstasha, she contacted POC and they will remove the negative preg test result'

## 2017-04-07 NOTE — ED Triage Notes (Signed)
PT C/O: back pain and nausea x3 weeks.... LMP = around 02/10/17... Took a pregnancy test at home and it was positive  Pt just wants to make sure    DENIES: abd pain, vag bleeding  TAKING MEDS: none  A&O x4... NAD... Ambulatory

## 2017-04-12 NOTE — ED Provider Notes (Signed)
[facility]ARE [facility]   [id number] 04/07/17 Arrival Time: 1313  ASSESSMENT & PLAN:  1. Positive urine pregnancy test     Meds ordered this encounter  Medications  . promethazine (PHENERGAN) 25 MG tablet    Sig: Take 1 tablet (25 mg total) by mouth every 6 (six) hours as needed for nausea or vomiting.    Dispense:  30 tablet    Refill:  0   Will schedule OB follow up as soon as possible. Will do her best to ensure adequate fluid intake. May f/u here as needed.  Reviewed expectations re: course of current medical issues. Questions answered. Outlined signs and symptoms indicating need for more acute intervention. Patient verbalized understanding. After Visit Summary given.   SUBJECTIVE:  Olivia Park is a 32 y.o. female who presents with complaint of intermittent low back discomfort and intermittent nausea without emesis. Onset gradual, approximately 3 weeks ago. Back discomfort described as aching. Symptoms are stable since beginning. Aggravating factors: none. Alleviating factors: none. Associated symptoms: none. She denies anorexia, belching, chills, constipation, diarrhea, dysuria, fever, hematuria and sweats. Appetite: normal. PO intake: normal. Ambulatory without assistance. Urinary symptoms: none. Last bowel movement yesterday without blood. OTC treatment: None. Reports + pregnancy test at home.  Patient's last menstrual period was 02/10/2017.   History reviewed. No pertinent surgical history.  ROS: As per HPI.  OBJECTIVE:  Vitals:   04/07/17 1408  BP: (!) 114/51  Pulse: 85  Resp: 18  Temp: 99.5 F (37.5 C)  TempSrc: Oral  SpO2: 100%    General appearance: alert; no distress Lungs: clear to auscultation bilaterally Heart: regular rate and rhythm Abdomen: soft; non-distended; no tenderness; bowel sounds present; no masses or organomegaly; no guarding or rebound tenderness Back: no CVA tenderness; FROM at hips Extremities: no edema; symmetrical with no  gross deformities Skin: warm and dry Psychological: alert and cooperative; normal mood and affect  Labs: Results for orders placed or performed during the hospital encounter of 04/07/17  POCT urinalysis dip (device)  Result Value Ref Range   Glucose, UA NEGATIVE NEGATIVE mg/dL   Bilirubin Urine NEGATIVE NEGATIVE   Ketones, ur 15 (A) NEGATIVE mg/dL   Specific Gravity, Urine 1.025 1.005 - 1.030   Hgb urine dipstick NEGATIVE NEGATIVE   pH 7.0 5.0 - 8.0   Protein, ur NEGATIVE NEGATIVE mg/dL   Urobilinogen, UA 0.2 0.0 - 1.0 mg/dL   Nitrite NEGATIVE NEGATIVE   Leukocytes, UA NEGATIVE NEGATIVE  Pregnancy, urine POC  Result Value Ref Range   Preg Test, Ur NEGATIVE NEGATIVE  Pregnancy, urine POC  Result Value Ref Range   Preg Test, Ur POSITIVE (A) NEGATIVE   Labs Reviewed  POCT URINALYSIS DIP (DEVICE) - Abnormal; Notable for the following components:      Result Value   Ketones, ur 15 (*)    All other components within normal limits  POCT PREGNANCY, URINE - Abnormal; Notable for the following components:   Preg Test, Ur POSITIVE (*)    All other components within normal limits  POCT PREGNANCY, URINE    No Known Allergies                                              Social History   Socioeconomic History  . Marital status: Single    Spouse name: Not on file  . Number of children: Not  on file  . Years of education: Not on file  . Highest education level: Not on file  Social Needs  . Financial resource strain: Not on file  . Food insecurity - worry: Not on file  . Food insecurity - inability: Not on file  . Transportation needs - medical: Not on file  . Transportation needs - non-medical: Not on file  Occupational History  . Not on file  Tobacco Use  . Smoking status: Never Smoker  . Smokeless tobacco: Never Used  Substance and Sexual Activity  . Alcohol use: No    Comment: socially  . Drug use: No  . Sexual activity: Not on file  Other Topics Concern  . Not on  file  Social History Narrative  . Not on file      Mardella LaymanHagler, Terah Robey, MD 04/12/17 [id number]

## 2017-04-28 ENCOUNTER — Encounter (HOSPITAL_COMMUNITY): Payer: Self-pay | Admitting: Emergency Medicine

## 2017-04-28 ENCOUNTER — Emergency Department (HOSPITAL_COMMUNITY)
Admission: EM | Admit: 2017-04-28 | Discharge: 2017-04-28 | Disposition: A | Discharge status: Home / Self Care | Payer: Medicaid Other | Attending: Emergency Medicine | Admitting: Emergency Medicine

## 2017-04-28 ENCOUNTER — Other Ambulatory Visit: Payer: Self-pay

## 2017-04-28 DIAGNOSIS — J111 Influenza due to unidentified influenza virus with other respiratory manifestations: Secondary | ICD-10-CM | POA: Insufficient documentation

## 2017-04-28 DIAGNOSIS — O98512 Other viral diseases complicating pregnancy, second trimester: Secondary | ICD-10-CM | POA: Diagnosis present

## 2017-04-28 DIAGNOSIS — Z3A14 14 weeks gestation of pregnancy: Secondary | ICD-10-CM | POA: Diagnosis not present

## 2017-04-28 DIAGNOSIS — R6889 Other general symptoms and signs: Secondary | ICD-10-CM

## 2017-04-28 MED ORDER — OSELTAMIVIR PHOSPHATE 75 MG PO CAPS: 75.0000 mg | ORAL_CAPSULE | Freq: Two times a day (BID) | ORAL | 0 refills | Status: DC

## 2017-04-28 NOTE — ED Notes (Signed)
ED Provider at bedside. 

## 2017-04-28 NOTE — ED Notes (Signed)
Pt verbalized understanding of discharge instructions and denies any further questions at this time.   

## 2017-04-28 NOTE — ED Provider Notes (Signed)
[facility] Provider Note   CSN: [id number] Arrival date & time: 04/28/17  [id number]     History   Chief Complaint Chief Complaint  Patient presents with  . flu like symptoms    HPI Olivia Park is a 32 y.o. female who presents with flulike symptoms.  She is currently [redacted] weeks pregnant and is receiving regular prenatal care.  She states she had an acute onset of symptoms starting yesterday.  She's had fever, chills, body aches, sweats, headache, nasal congestion and runny nose, sore throat.  She has had a mild cough but no chest pain or shortness of breath.  No abdominal pain, nausea or vomiting.  Her son is currently being treated for the flu as well.  She has not taken anything over-the-counter.  Her next appointment with her OB/GYN is on February 18.  HPI  History reviewed. No pertinent past medical history.  There are no active problems to display for this patient.   History reviewed. No pertinent surgical history.  OB History    Gravida Para Term Preterm AB Living   1             SAB TAB Ectopic Multiple Live Births                   Home Medications    Prior to Admission medications   Medication Sig Start Date End Date Taking? Authorizing Provider  oseltamivir (TAMIFLU) 75 MG capsule Take 1 capsule (75 mg total) by mouth every 12 (twelve) hours. 04/28/17   Bethel BornGekas, Kelly Marie, PA-C  Prenat w/o A Vit-FeFum-FePo-FA (CONCEPT OB) 130-92.4-1 MG CAPS Take 1 tablet by mouth daily. 03/15/17   Katrinka BlazingSmith, [place], CNM  promethazine (PHENERGAN) 25 MG tablet Take 1 tablet (25 mg total) by mouth every 6 (six) hours as needed for nausea or vomiting. 04/07/17   Mardella LaymanHagler, Brian, MD    Family History No family history on file.  Social History Social History   Tobacco Use  . Smoking status: Never Smoker  . Smokeless tobacco: Never Used  Substance Use Topics  . Alcohol use: No    Comment: socially  . Drug use: No     Allergies   Patient  has no known allergies.   Review of Systems Review of Systems  Constitutional: Positive for chills, diaphoresis and fever.  HENT: Positive for congestion, rhinorrhea and sore throat. Negative for ear pain.   Respiratory: Positive for cough. Negative for shortness of breath.   Cardiovascular: Negative for chest pain.  Gastrointestinal: Negative for abdominal pain, nausea and vomiting.  Musculoskeletal: Positive for myalgias.     Physical Exam Updated Vital Signs BP 131/86 (BP Location: Right Arm)   Pulse 98   Temp 99.2 F (37.3 C) (Oral)   Ht 5\' 5"  (1.651 m)   Wt 97.1 kg (214 lb)   LMP 02/10/2017   SpO2 100%   BMI 35.61 kg/m   Physical Exam  Constitutional: She is oriented to person, place, and time. She appears well-developed and well-nourished. No distress.  HENT:  Head: Normocephalic and atraumatic.  Right Ear: Hearing, tympanic membrane, external ear and ear canal normal.  Left Ear: Hearing, tympanic membrane, external ear and ear canal normal.  Nose: Nose normal.  Mouth/Throat: Uvula is midline, oropharynx is clear and moist and mucous membranes are normal.  Eyes: Conjunctivae are normal. Pupils are equal, round, and reactive to light. Right eye exhibits no discharge. Left eye exhibits no discharge. No scleral  icterus.  Neck: Normal range of motion.  Cardiovascular: Normal rate and regular rhythm. Exam reveals no gallop and no friction rub.  No murmur heard. Pulmonary/Chest: Effort normal and breath sounds normal. No stridor. No respiratory distress. She has no wheezes. She has no rales. She exhibits no tenderness.  Abdominal: She exhibits no distension.  Neurological: She is alert and oriented to person, place, and time.  Skin: Skin is warm and dry.  Psychiatric: She has a normal mood and affect. Her behavior is normal.  Nursing note and vitals reviewed.    ED Treatments / Results  Labs (all labs ordered are listed, but only abnormal results are displayed) Labs  Reviewed - No data to display  EKG  EKG Interpretation None       Radiology No results found.  Procedures Procedures (including critical care time)  Medications Ordered in ED Medications - No data to display   Initial Impression / Assessment and Plan / ED Course  I have reviewed the triage vital signs and the nursing notes.  Pertinent labs & imaging results that were available during my care of the patient were reviewed by me and considered in my medical decision making (see chart for details).  32 year old with flulike illness and positive sick contact with the flu.  Vital signs are normal.  She is well-appearing.  She is encouraged to rest, drink plenty of fluids, take Tylenol and was given a prescription for Tamiflu.  She has a follow-up in several weeks with OB/GYN.  She is encouraged to follow-up sooner if needed.  Final Clinical Impressions(s) / ED Diagnoses   Final diagnoses:  Flu-like symptoms    ED Discharge Orders        Ordered    oseltamivir (TAMIFLU) 75 MG capsule  Every 12 hours     04/28/17 1005       Bethel Born, PA-C 04/28/17 1010    Pricilla Loveless, MD 04/29/17 [phone]

## 2017-04-28 NOTE — Discharge Instructions (Signed)
Please take Tamiflu twice daily for 5 days. Take with food Take Promethazine for nausea if needed Drink plenty of fluids and rest Return if worsening

## 2017-04-28 NOTE — ED Triage Notes (Addendum)
Flu like symptoms started yesterday morning, son at home with flu x 3 days-- subjective fever at home.   [facility]

## 2017-05-05 ENCOUNTER — Encounter: Payer: Self-pay | Admitting: Student

## 2017-05-05 ENCOUNTER — Ambulatory Visit (INDEPENDENT_AMBULATORY_CARE_PROVIDER_SITE_OTHER): Payer: Medicaid Other | Admitting: Student

## 2017-05-05 VITALS — BP 133/88 | HR 70 | Wt 209.0 lb

## 2017-05-05 DIAGNOSIS — Z3490 Encounter for supervision of normal pregnancy, unspecified, unspecified trimester: Secondary | ICD-10-CM | POA: Insufficient documentation

## 2017-05-05 DIAGNOSIS — Z3481 Encounter for supervision of other normal pregnancy, first trimester: Secondary | ICD-10-CM

## 2017-05-05 DIAGNOSIS — Z3492 Encounter for supervision of normal pregnancy, unspecified, second trimester: Secondary | ICD-10-CM

## 2017-05-05 DIAGNOSIS — Z3482 Encounter for supervision of other normal pregnancy, second trimester: Secondary | ICD-10-CM

## 2017-05-05 MED ORDER — ONDANSETRON HCL 8 MG PO TABS: 8.0000 mg | ORAL_TABLET | Freq: Three times a day (TID) | ORAL | 2 refills | Status: DC | PRN

## 2017-05-05 MED ORDER — PRENATAL VITAMINS 0.8 MG PO TABS: 1.0000 | ORAL_TABLET | Freq: Every day | ORAL | 12 refills | Status: DC

## 2017-05-05 NOTE — Patient Instructions (Signed)

## 2017-05-05 NOTE — Progress Notes (Signed)
  Subjective:    Olivia Park is being seen today for her first obstetrical visit.  This is not a planned pregnancy. She is at 1911w4d gestation. Her obstetrical history is significant for nothing.  Relationship with FOB: significant other, not living together. Patient does intend to breast feed. Pregnancy history fully reviewed.  Patient reports no complaints.  Review of Systems:   Review of Systems  Constitutional: Negative.   HENT: Negative.   Respiratory: Negative.   Cardiovascular: Negative.   Gastrointestinal: Negative.   Genitourinary: Negative.   Musculoskeletal: Negative.   Neurological: Negative.   Hematological: Negative.   Psychiatric/Behavioral: Negative.     Objective:     BP 133/88   Pulse 70   Wt 209 lb (94.8 kg)   LMP 01/23/2017   BMI 34.78 kg/m  Physical Exam  Constitutional: She is oriented to person, place, and time. She appears well-developed and well-nourished.  HENT:  Head: Normocephalic.  Eyes: Pupils are equal, round, and reactive to light.  Neck: Normal range of motion.  Cardiovascular: Normal rate.  Respiratory: Effort normal and breath sounds normal.  GI: Soft. Bowel sounds are normal.  Musculoskeletal: Normal range of motion.  Neurological: She is alert and oriented to person, place, and time.  Skin: Skin is warm and dry.  Psychiatric: She has a normal mood and affect.    Exam    Assessment:    Pregnancy: G2P1001 Patient Active Problem List   Diagnosis Date Noted  . Supervision of normal intrauterine pregnancy in multigravida in first trimester 05/05/2017       Plan:     Initial labs drawn. Prenatal vitamins. Problem list reviewed and updated. AFP3 discussed: will do NIPS and MSAFP at next visit, once gestational age is confirmed.  Role of ultrasound in pregnancy discussed; fetal survey: ordered. Amniocentesis discussed: not indicated. Follow up in 4 weeks. 75% of 30 min visit spent on counseling and coordination of  care.  -Reviewed patient's pregnancy history -Patient is unsure about her LMP; advised her that her due date may change based on US.  -US ordered; ob box reviewed.   Olivia Park 05/05/2017

## 2017-05-10 ENCOUNTER — Other Ambulatory Visit: Payer: Self-pay | Admitting: Student

## 2017-05-10 DIAGNOSIS — N3000 Acute cystitis without hematuria: Secondary | ICD-10-CM

## 2017-05-10 DIAGNOSIS — N390 Urinary tract infection, site not specified: Secondary | ICD-10-CM | POA: Insufficient documentation

## 2017-05-10 LAB — URINE CULTURE, OB REFLEX

## 2017-05-10 LAB — CULTURE, OB URINE

## 2017-05-10 MED ORDER — NITROFURANTOIN MONOHYD MACRO 100 MG PO CAPS: 100.0000 mg | ORAL_CAPSULE | Freq: Two times a day (BID) | ORAL | 0 refills | Status: DC

## 2017-05-12 ENCOUNTER — Telehealth: Payer: Self-pay | Admitting: Student

## 2017-05-12 LAB — OBSTETRIC PANEL, INCLUDING HIV
Antibody Screen: NEGATIVE
BASOS ABS: 0 10*3/uL (ref 0.0–0.2)
Basos: 0 %
EOS (ABSOLUTE): 0.1 10*3/uL (ref 0.0–0.4)
EOS: 1 %
HEMOGLOBIN: 12 g/dL (ref 11.1–15.9)
HEP B S AG: NEGATIVE
HIV Screen 4th Generation wRfx: NONREACTIVE
Hematocrit: 35.6 % (ref 34.0–46.6)
IMMATURE GRANS (ABS): 0 10*3/uL (ref 0.0–0.1)
IMMATURE GRANULOCYTES: 0 %
LYMPHS: 21 %
Lymphocytes Absolute: 2.2 10*3/uL (ref 0.7–3.1)
MCH: 28.6 pg (ref 26.6–33.0)
MCHC: 33.7 g/dL (ref 31.5–35.7)
MCV: 85 fL (ref 79–97)
MONOCYTES: 5 %
Monocytes Absolute: 0.5 10*3/uL (ref 0.1–0.9)
NEUTROS PCT: 73 %
Neutrophils Absolute: 7.3 10*3/uL — ABNORMAL HIGH (ref 1.4–7.0)
PLATELETS: 257 10*3/uL (ref 150–379)
RBC: 4.19 x10E6/uL (ref 3.77–5.28)
RDW: 13.8 % (ref 12.3–15.4)
RH TYPE: POSITIVE
RPR: NONREACTIVE
Rubella Antibodies, IGG: 1.64 index (ref >0.99)
WBC: 10.1 10*3/uL (ref 3.4–10.8)

## 2017-05-12 LAB — HEMOGLOBINOPATHY EVALUATION
FERRITIN: 128 ng/mL (ref 15–150)
HGB C: 0 %
HGB S: 0 %
HGB VARIANT: 0 %
Hgb A2 Quant: 2 % (ref 1.8–3.2)
Hgb A: 98 % (ref 96.4–98.8)
Hgb F Quant: 0 % (ref 0.0–2.0)
Hgb Solubility: NEGATIVE

## 2017-05-12 LAB — SMN1 COPY NUMBER ANALYSIS (SMA CARRIER SCREENING)

## 2017-05-12 LAB — CYSTIC FIBROSIS GENE TEST

## 2017-05-12 NOTE — Telephone Encounter (Signed)
Spoke with patient; discussed UTI. Reviewed medication and the importance of taking her meds. Patient verbalized understanding and will pick up RX.  Olivia Park

## 2017-05-12 NOTE — Telephone Encounter (Signed)
Called patient and left message asking her to call [facility] for results and to check pharmacy for antibiotic RX.  Olivia KitchensKathryn Park

## 2017-05-17 ENCOUNTER — Encounter: Payer: Self-pay | Admitting: *Deleted

## 2017-05-17 LAB — PREGNANCY, URINE: PREG TEST UR: POSITIVE

## 2017-06-03 ENCOUNTER — Encounter: Payer: Medicaid Other | Admitting: Medical

## 2017-06-06 ENCOUNTER — Ambulatory Visit (HOSPITAL_COMMUNITY)
Admission: RE | Admit: 2017-06-06 | Discharge: 2017-06-06 | Disposition: A | Discharge status: Home / Self Care | Payer: Medicaid Other | Source: Ambulatory Visit | Attending: Student | Admitting: Student

## 2017-06-06 ENCOUNTER — Other Ambulatory Visit: Payer: Self-pay | Admitting: Student

## 2017-06-06 DIAGNOSIS — Z3481 Encounter for supervision of other normal pregnancy, first trimester: Secondary | ICD-10-CM | POA: Diagnosis present

## 2017-06-06 DIAGNOSIS — Z3492 Encounter for supervision of normal pregnancy, unspecified, second trimester: Secondary | ICD-10-CM

## 2017-06-06 DIAGNOSIS — Z3689 Encounter for other specified antenatal screening: Secondary | ICD-10-CM | POA: Diagnosis present

## 2017-06-06 DIAGNOSIS — Z3A20 20 weeks gestation of pregnancy: Secondary | ICD-10-CM | POA: Diagnosis not present

## 2017-06-16 ENCOUNTER — Encounter: Payer: Self-pay | Admitting: Medical

## 2017-06-16 ENCOUNTER — Other Ambulatory Visit (HOSPITAL_COMMUNITY)
Admission: RE | Admit: 2017-06-16 | Discharge: 2017-06-16 | Disposition: A | Discharge status: Home / Self Care | Payer: Medicaid Other | Source: Ambulatory Visit | Attending: Medical | Admitting: Medical

## 2017-06-16 ENCOUNTER — Ambulatory Visit (INDEPENDENT_AMBULATORY_CARE_PROVIDER_SITE_OTHER): Payer: Medicaid Other | Admitting: Medical

## 2017-06-16 VITALS — BP 127/75 | HR 90 | Wt 218.2 lb

## 2017-06-16 DIAGNOSIS — IMO0002 Reserved for concepts with insufficient information to code with codable children: Secondary | ICD-10-CM

## 2017-06-16 DIAGNOSIS — Z3481 Encounter for supervision of other normal pregnancy, first trimester: Secondary | ICD-10-CM | POA: Diagnosis not present

## 2017-06-16 DIAGNOSIS — B379 Candidiasis, unspecified: Secondary | ICD-10-CM

## 2017-06-16 DIAGNOSIS — N3000 Acute cystitis without hematuria: Secondary | ICD-10-CM

## 2017-06-16 DIAGNOSIS — Z0489 Encounter for examination and observation for other specified reasons: Secondary | ICD-10-CM

## 2017-06-16 NOTE — Patient Instructions (Signed)
Second Trimester of Pregnancy The second trimester is from week 13 through week 28, month 4 through 6. This is often the time in pregnancy that you feel your best. Often times, morning sickness has lessened or quit. You may have more energy, and you may get hungry more often. Your unborn baby (fetus) is growing rapidly. At the end of the sixth month, he or she is about 9 inches long and weighs about 1 pounds. You will likely feel the baby move (quickening) between 18 and 20 weeks of pregnancy. Follow these instructions at home:  Avoid all smoking, herbs, and alcohol. Avoid drugs not approved by your doctor.  Do not use any tobacco products, including cigarettes, chewing tobacco, and electronic cigarettes. If you need help quitting, ask your doctor. You may get counseling or other support to help you quit.  Only take medicine as told by your doctor. Some medicines are safe and some are not during pregnancy.  Exercise only as told by your doctor. Stop exercising if you start having cramps.  Eat regular, healthy meals.  Wear a good support bra if your breasts are tender.  Do not use hot tubs, steam rooms, or saunas.  Wear your seat belt when driving.  Avoid raw meat, uncooked cheese, and liter boxes and soil used by cats.  Take your prenatal vitamins.  Take 1500-2000 milligrams of calcium daily starting at the 20th week of pregnancy until you deliver your baby.  Try taking medicine that helps you poop (stool softener) as needed, and if your doctor approves. Eat more fiber by eating fresh fruit, vegetables, and whole grains. Drink enough fluids to keep your pee (urine) clear or pale yellow.  Take warm water baths (sitz baths) to soothe pain or discomfort caused by hemorrhoids. Use hemorrhoid cream if your doctor approves.  If you have puffy, bulging veins (varicose veins), wear support hose. Raise (elevate) your feet for 15 minutes, 3-4 times a day. Limit salt in your diet.  Avoid heavy  lifting, wear low heals, and sit up straight.  Rest with your legs raised if you have leg cramps or low back pain.  Visit your dentist if you have not gone during your pregnancy. Use a soft toothbrush to brush your teeth. Be gentle when you floss.  You can have sex (intercourse) unless your doctor tells you not to.  Go to your doctor visits. Get help if:  You feel dizzy.  You have mild cramps or pressure in your lower belly (abdomen).  You have a nagging pain in your belly area.  You continue to feel sick to your stomach (nauseous), throw up (vomit), or have watery poop (diarrhea).  You have bad smelling fluid coming from your vagina.  You have pain with peeing (urination). Get help right away if:  You have a fever.  You are leaking fluid from your vagina.  You have spotting or bleeding from your vagina.  You have severe belly cramping or pain.  You lose or gain weight rapidly.  You have trouble catching your breath and have chest pain.  You notice sudden or extreme puffiness (swelling) of your face, hands, ankles, feet, or legs.  You have not felt the baby move in over an hour.  You have severe headaches that do not go away with medicine.  You have vision changes. This information is not intended to replace advice given to you by your health care provider. Make sure you discuss any questions you have with your health care   provider. Document Released: 06/09/2009 Document Revised: 08/21/2015 Document Reviewed: 05/16/2012 Elsevier Interactive Patient Education  2017 [employer]  

## 2017-06-16 NOTE — Progress Notes (Signed)
   PRENATAL VISIT NOTE  Subjective:  Olivia Park is a 32 y.o. G2P1001 at 5940w6d being seen today for ongoing prenatal care.  She is currently monitored for the following issues for this low-risk pregnancy and has Supervision of normal intrauterine pregnancy in multigravida in first trimester; Uncertain dates, antepartum; and UTI (urinary tract infection) on their problem list.  Patient reports no complaints.  Contractions: Not present. Vag. Bleeding: None.  Movement: Present. Denies leaking of fluid.   The following portions of the patient's history were reviewed and updated as appropriate: allergies, current medications, past family history, past medical history, past social history, past surgical history and problem list. Problem list updated.  Objective:   Vitals:   06/16/17 0819  BP: 127/75  Pulse: 90  Weight: 218 lb 3.2 oz (99 kg)    Fetal Status: Fetal Heart Rate (bpm): 159 Fundal Height: 21 cm Movement: Present     General:  Alert, oriented and cooperative. Patient is in no acute distress.  Skin: Skin is warm and dry. No rash noted.   Cardiovascular: Normal heart rate noted  Respiratory: Normal respiratory effort, no problems with respiration noted  Abdomen: Soft, gravid, appropriate for gestational age.  Pain/Pressure: Absent     Pelvic: Cervical exam deferred        Extremities: Normal range of motion.  Edema: None  Mental Status:  Normal mood and affect. Normal behavior. Normal judgment and thought content.   Assessment and Plan:  Pregnancy: G2P1001 at 4040w6d  1. Supervision of normal intrauterine pregnancy in multigravida in first trimester - Genetic Screening  2. Evaluate anatomy not seen on prior sonogram - US MFM OB FOLLOW UP; scheduled to complete anatomy   Preterm labor/second trimester warning symptoms and general obstetric precautions including but not limited to vaginal bleeding, contractions, leaking of fluid and fetal movement were reviewed in detail  with the patient. Please refer to After Visit Summary for other counseling recommendations.  Return in about 1 month (around 07/14/2017) for LOB.   Vonzella NippleJulie Wenzel, PA-C

## 2017-06-16 NOTE — Progress Notes (Signed)
Medicaid Home Form Processed

## 2017-06-16 NOTE — Addendum Note (Signed)
Addended by: Henrietta DineNEAL, PAMELA S on: 06/16/2017 09:09 AM   Modules accepted: Orders

## 2017-06-18 LAB — CULTURE, OB URINE

## 2017-06-18 LAB — URINE CULTURE, OB REFLEX

## 2017-06-21 ENCOUNTER — Encounter: Payer: Self-pay | Admitting: Medical

## 2017-06-21 LAB — CYTOLOGY - PAP
Adequacy: ABSENT
CHLAMYDIA, DNA PROBE: NEGATIVE
Diagnosis: NEGATIVE
HPV (WINDOPATH): NOT DETECTED
NEISSERIA GONORRHEA: NEGATIVE

## 2017-06-21 MED ORDER — TERCONAZOLE 0.8 % VA CREA: 1.0000 | TOPICAL_CREAM | Freq: Every day | VAGINAL | 0 refills | Status: DC

## 2017-06-21 NOTE — Addendum Note (Signed)
Addended by: Marny LowensteinWENZEL, JULIE N on: 06/21/2017 10:32 AM   Modules accepted: Orders, SmartSet

## 2017-06-24 ENCOUNTER — Encounter: Payer: Self-pay | Admitting: *Deleted

## 2017-06-27 ENCOUNTER — Encounter: Payer: Self-pay | Admitting: *Deleted

## 2017-06-29 ENCOUNTER — Encounter: Payer: Self-pay | Admitting: *Deleted

## 2017-07-04 ENCOUNTER — Ambulatory Visit (HOSPITAL_COMMUNITY)
Admission: RE | Admit: 2017-07-04 | Discharge: 2017-07-04 | Disposition: A | Discharge status: Home / Self Care | Payer: Medicaid Other | Source: Ambulatory Visit | Attending: Medical | Admitting: Medical

## 2017-07-04 DIAGNOSIS — Z362 Encounter for other antenatal screening follow-up: Secondary | ICD-10-CM | POA: Insufficient documentation

## 2017-07-04 DIAGNOSIS — Z0489 Encounter for examination and observation for other specified reasons: Secondary | ICD-10-CM

## 2017-07-04 DIAGNOSIS — Z3A24 24 weeks gestation of pregnancy: Secondary | ICD-10-CM | POA: Diagnosis not present

## 2017-07-04 DIAGNOSIS — IMO0002 Reserved for concepts with insufficient information to code with codable children: Secondary | ICD-10-CM

## 2017-07-07 ENCOUNTER — Ambulatory Visit (INDEPENDENT_AMBULATORY_CARE_PROVIDER_SITE_OTHER): Payer: Medicaid Other | Admitting: Student

## 2017-07-07 VITALS — Wt 222.8 lb

## 2017-07-07 DIAGNOSIS — Z3481 Encounter for supervision of other normal pregnancy, first trimester: Secondary | ICD-10-CM

## 2017-07-07 NOTE — Patient Instructions (Addendum)
Childbirth Education Options: [facility] Department Classes:  Childbirth education classes can help you get ready for a positive parenting experience. You can also meet other expectant parents and get free stuff for your baby. Each class runs for five weeks on the same night and costs $45 for the mother-to-be and her support person. Medicaid covers the cost if you are eligible. Call [phone] to register. [facility] Childbirth Education:  [phone] or [phone] or [email or web address]  Baby & Me Class: Discuss newborn & infant parenting and family adjustment issues with other new mothers in a relaxed environment. Each week brings a new speaker or baby-centered activity. We encourage new mothers to join [place] every Thursday at 11:00am. Babies birth until crawling. No registration or fee. Daddy [employer]: This course offers Dads-to-be the tools and knowledge needed to feel confident on their journey to becoming new fathers. Experienced dads, who have been trained as coaches, teach dads-to-be how to hold, comfort, diaper, swaddle and play with their infant while being able to support the new mom as well. A class for men taught by men. $25/dad Big Brother/Big Sister: Let your children share in the joy of a new brother or sister in this special class designed just for them. Class includes discussion about how families care for babies: swaddling, holding, diapering, safety as well as how they can be helpful in their new role. This class is designed for children ages 45 to 48, but any age is welcome. Please register each child individually. $5/child  Mom Talk: This mom-led group offers support and connection to mothers as they journey through the adjustments and struggles of that sometimes overwhelming first year after the birth of a child. Tuesdays at 10:00am and Thursdays at 6:00pm. Babies welcome. No registration or fee. Breastfeeding Support Group: This group is a mother-to-mother  support circle where moms have the opportunity to share their breastfeeding experiences. A Lactation Consultant is present for questions and concerns. Meets each Tuesday at 11:00am. No fee or registration. Breastfeeding Your Baby: Learn what to expect in the first days of breastfeeding your newborn.  This class will help you feel more confident with the skills needed to begin your breastfeeding experience. Many new mothers are concerned about breastfeeding after leaving the hospital. This class will also address the most common fears and challenges about breastfeeding during the first few weeks, months and beyond. (call for fee) Comfort Techniques and Tour: This 2 hour interactive class will provide you the opportunity to learn & practice hands-on techniques that can help relieve some of the discomfort of labor and encourage your baby to rotate toward the best position for birth. You and your partner will be able to try a variety of labor positions with birth balls and rebozos as well as practice breathing, relaxation, and visualization techniques. A tour of the [facility] is included with this class. $20 per registrant and support person Childbirth Class- Weekend Option: This class is a Weekend version of our Birth & Baby series. It is designed for parents who have a difficult time fitting several weeks of classes into their schedule. It covers the care of your newborn and the basics of labor and childbirth. It also includes a [place] [facility] and lunch. The class is held two consecutive days: beginning on Friday evening from 6:30 - 8:30 p.m. and the next day, Saturday from 9 a.m. - 4 p.m. (call for fee) Doren Custard Class: Interested in a waterbirth?  This  informational class will help you discover whether waterbirth is the right fit for you. Education about waterbirth itself, supplies you would need and how to assemble your support team is what you can  expect from this class. Some obstetrical practices require this class in order to pursue a waterbirth. (Not all obstetrical practices offer waterbirth-check with your healthcare provider.) Register only the expectant mom, but you are encouraged to bring your partner to class! Required if planning waterbirth, no fee. Infant/Child CPR: Parents, grandparents, babysitters, and friends learn Cardio-Pulmonary Resuscitation skills for infants and children. You will also learn how to treat both conscious and unconscious choking in infants and children. This Family & Friends program does not offer certification. Register each participant individually to ensure that enough mannequins are available. (Call for fee) Grandparent Love: Expecting a grandbaby? This class is for you! Learn about the latest infant care and safety recommendations and ways to support your own child as he or she transitions into the parenting role. Taught by Registered Nurses who are childbirth instructors, but most importantly...they are grandmothers too! $10/person. Childbirth Class- Natural Childbirth: This series of 5 weekly classes is for expectant parents who want to learn and practice natural methods of coping with the process of labor and childbirth. Relaxation, breathing, massage, visualization, role of the partner, and helpful positioning are highlighted. Participants learn how to be confident in their body's ability to give birth. This class will empower and help parents make informed decisions about their own care. Includes discussion that will help new parents transition into the immediate postpartum period. Maternity Care Center Tour of [facility] is included. We suggest taking this class between 25-32 weeks, but it's only a recommendation. $75 per registrant and one support person or $30 Medicaid. Childbirth Class- 3 week Series: This option of 3 weekly classes helps you and your labor partner prepare for childbirth. Newborn  care, labor & birth, cesarean birth, pain management, and comfort techniques are discussed and a Maternity Care Center Tour of [facility] is included. The class meets at the same time, on the same day of the week for 3 consecutive weeks beginning with the starting date you choose. $60 for registrant and one support person.  Marvelous Multiples: Expecting twins, triplets, or more? This class covers the differences in labor, birth, parenting, and breastfeeding issues that face multiples' parents. NICU tour is included. Led by a Certified Childbirth Educator who is the mother of twins. No fee. Caring for Baby: This class is for expectant and adoptive parents who want to learn and practice the most up-to-date newborn care for their babies. Focus is on birth through the first six weeks of life. Topics include feeding, bathing, diapering, crying, umbilical cord care, circumcision care and safe sleep. Parents learn to recognize symptoms of illness and when to call the pediatrician. Register only the mom-to-be and your partner or support person can plan to come with you! $10 per registrant and support person Childbirth Class- online option: This online class offers you the freedom to complete a Birth and Baby series in the comfort of your own home. The flexibility of this option allows you to review sections at your own pace, at times convenient to you and your support people. It includes additional video information, animations, quizzes, and extended activities. Get organized with helpful eClass tools, checklists, and trackers. Once you register online for the class, you will receive an email within a few days to accept the invitation and begin the class when the time   is right for you. The content will be available to you for 60 days. $60 for 60 days of online access for you and your support people.  Local Doulas: Natural Baby Doulas [email or web address] Tel:  [phone] [email or web address] [employer] [phone] [email or web address] The Labor Hassell Halim  (also do waterbirth tub rental) [phone] [email or web address] Triad Birth Doula [phone] [email or web address] NotebookDistributors.fi Sacred Rhythms  [phone] https://sacred-rhythms.com/ [employer] (PADA) [email or web address] https://www.frey.org/ [place] Birth and Baby  http://[email or web address]/ Considering Waterbirth? Guide for patients at [facility] [employer]  Why consider waterbirth?  . Gentle birth for babies . Less pain medicine used in labor . May allow for passive descent/less pushing . May reduce perineal tears  . More mobility and instinctive maternal position changes . Increased maternal relaxation . Reduced blood pressure in labor  Is waterbirth safe? What are the risks of infection, drowning or other complications?  . Infection: o Very low risk (3.7 % for tub vs 4.8% for bed) o 7 in 8000 waterbirths with documented infection o Poorly cleaned equipment most common cause o Slightly lower group B strep transmission rate  . Drowning o Maternal:  - Very low risk   - Related to seizures or fainting o Newborn:  - Very low risk. No evidence of increased risk of respiratory problems in multiple large studies - Physiological protection from breathing under water - Avoid underwater birth if there are any fetal complications - Once baby's head is out of the water, keep it out.  . Birth complication o Some reports of cord trauma, but risk decreased by bringing baby to surface gradually o No evidence of increased risk of shoulder dystocia. Mothers can usually change positions faster in water than in a bed, possibly aiding the maneuvers to free the shoulder.   You must attend a Doren Custard class at [facility]  3rd Wednesday of every month from 7-9pm  [employer] by calling [phone] or online at [email or web address]  Bring [place] the certificate from the class to your prenatal appointment  Meet with a midwife at 36 weeks to see if you can still plan a waterbirth and to sign the consent.   Purchase or rent the following supplies:   Water Birth Pool (Birth Pool in a Box or [place] for instance)  (Tubs start ~$125)  Single-use disposable tub liner designed for your brand of tub  New garden hose labeled "lead-free", "suitable for drinking water",  Electric drain pump to remove water (We recommend 792 gallon per hour or greater pump.)   Separate garden hose to remove the dirty water  Fish net  Bathing suit top (optional)  Long-handled mirror (optional)  Places to purchase or rent supplies  GotWebTools.is for tub purchases and supplies  Waterbirthsolutions.com for tub purchases and supplies  The Labor Ladies (www.thelaborladies.com) $275 for tub rental/set-up & take down/kit   [employer] (http://www.fleming.com/.htm) Information regarding doulas (labor support) who provide pool rentals  Our practice has a Birth Pool in a Box tub at the hospital that you may borrow on a first-come-first-served basis. It is your responsibility to to set up, clean and break down the tub. We cannot guarantee the availability of this tub in advance. You are responsible for bringing all accessories listed above. If you do not have all necessary supplies you cannot have a waterbirth.    Things that would prevent you from having a waterbirth:  Premature, <37wks  Previous cesarean birth  Presence of thick meconium-stained fluid  Multiple gestation (Twins,  triplets, etc.)  Uncontrolled diabetes or gestational diabetes requiring medication  Hypertension requiring medication or diagnosis of pre-eclampsia  Heavy vaginal bleeding  Non-reassuring fetal  heart rate  Active infection (MRSA, etc.). Group B Strep is NOT a contraindication for  waterbirth.  If your labor has to be induced and induction method requires continuous  monitoring of the baby's heart rate  Other risks/issues identified by your obstetrical provider  Please remember that birth is unpredictable. Under certain unforeseeable circumstances your provider may advise against giving birth in the tub. These decisions will be made on a case-by-case basis and with the safety of you and your baby as our highest priority.   AREA PEDIATRIC/FAMILY PRACTICE PHYSICIANS  [facility] [address] [place], [place]  [address] Phone - [phone]   Fax - [phone]  [facility] [address] [place], [place] [address] Phone - [phone]   Fax - [phone]  [facility] AM[facility] [address] [place], [place]  [address] Phone - [phone]   Fax - [phone]  [facility] [address] [place], [place]  [address] Phone - [phone]   Fax - [phone]  [facility] [address] [place], [place]  [address] Phone - [phone]   Fax - [phone]  [facility] [address] [place], [place]  [address] Phone - [phone]   Fax - [phone]  [facility] [address] [place], [place]  [address] Phone - [phone]   Fax - [phone]  [facility] [address] [place], [place]  [address] Phone - [phone]   Fax - [phone]  [facility] [address] [place], [place]  [address] Phone - [phone]   Fax - [phone] [facility] [address] [place], [place]  [address] Phone - [phone]   Fax - [phone]  [facility] [address] [place], [place]  [address] Phone -  [phone]   Fax - [phone]  [facility] [address] [place], [place]  [address] Phone - [phone]   Fax - [phone]  [facility] [address] [place], [place]  [address] Phone - [phone]   Fax - [phone]  SHILPA GOSRANI [address] [place], [place]  [address] Phone - [phone]  [facility] [address] [place], [place]  [address] Phone - [phone]   Fax - [phone]  [facility] [address] [place], [place]  [address] Phone - [phone]   Fax - [phone]  [facility] [address] [place], [place]  [address] Phone - [phone]   Fax - [phone]  [facility] [address] [place], [place]  [address] Phone - [phone]   Fax - [phone]   [facility] [address] [place], [place]  [address] Phone - [phone]   Fax - [phone]  [facility] [address] [place], [place]  [address] Phone - [phone]   Fax - [phone]  DAVID RUBIN [address] [place],   [address] Phone - [phone]   Fax - [phone]  [facility] [address] ,   [address] Phone - [phone]   Fax - [phone]  [facility] [address]   [place], [place]  [address] Phone - [phone]   Fax - [phone] Arnaldo Natal [phone], [place]  [address] Phone - [phone]   Fax - [place] [address] [place], [place]  [address] Phone - [phone]   Fax - [place] [address] [place],   [address] Phone - [phone]   Fax - [phone]  [place] MD [address] [place] [address] Phone [phone]  Fax [phone]   Places to have your son circumcised:    [facility] [phone] while you are in hospital  [facility] [phone] $244 by 4 wks  [facility] [phone] $175 by 2 wks  [facility] [phone] $250 by 7 days [facility] [phone] $269 by 4 wks  These prices sometimes change but are roughly what you can expect to pay. Please call and confirm pricing.   Circumcision is considered an elective/non-medically necessary procedure. There are many reasons parents decide to have their sons circumsized. During the first year of life circumcised males have a reduced risk of urinary tract infections but after this year the rates between circumcised males and uncircumcised males are the same.  It is safe to have your son circumcised outside of the hospital and the places above perform them regularly.   Deciding about Circumcision in Baby Boys  (Up-to-date The Basics)  What is circumcision?  Circumcision is a surgery that removes the skin that covers the tip of the penis, called the "foreskin" Circumcision is usually done when a boy is between 30 and 53 days old. In the [place], circumcision is common. In some other countries, fewer boys are circumcised. Circumcision is a common tradition in some religions.  Should I have my baby boy circumcised?  There is no easy answer. Circumcision has some benefits. But it also has risks. After talking with your doctor, you will have to decide for yourself what is right for your family.  What are the benefits of circumcision?  Circumcised boys seem to have slightly lower rates of: ?Urinary tract infections ?Swelling of the opening at the tip of the penis Circumcised men seem to have slightly lower rates of: ?Urinary tract  infections ?Swelling of the opening at the tip of the penis ?Penis cancer ?HIV and other infections that you catch during sex ?Cervical cancer in the women they have sex with Even so, in the [place], the risks of these problems are small - even in boys and men who have not been circumcised. Plus, boys and men who are not circumcised can reduce these extra risks by: ?Cleaning their penis well ?Using condoms during sex  What are the risks of circumcision?  Risks include: ?Bleeding or infection from the surgery ?Damage to or amputation of the penis ?A chance that the doctor will cut off too much or not enough of the foreskin ?A chance that sex won't feel as good later in life Only about 1 out of every 200 circumcisions leads to problems. There is also a chance that your health insurance won't pay for circumcision.  How is circumcision done in baby boys?  First, the baby gets medicine for pain relief. This might be a cream on the skin or a shot into the base of the penis. Next, the doctor cleans the baby's penis well. Then he or she uses special tools to cut off the foreskin.  Finally, the doctor wraps a bandage (called gauze) around the baby's penis. If you have your baby circumcised, his doctor or nurse will give you instructions on how to care for him after the surgery. It is important that you follow those instructions carefully.

## 2017-07-07 NOTE — Progress Notes (Signed)
Patient ID: Olivia Park, female   DOB: 01-05-1986, 32 y.o.   MRN: [id number]     PRENATAL VISIT NOTE  Subjective:  Olivia PlumeLashanda T Wittke is a 32 y.o. G2P1001 at 6969w6d being seen today for ongoing prenatal care.  She is currently monitored for the following issues for this low-risk pregnancy and has Supervision of normal intrauterine pregnancy in multigravida in first trimester; Uncertain dates, antepartum; and UTI (urinary tract infection) on their problem list.  Patient reports backache.  Contractions: Not present. Vag. Bleeding: None.  Movement: Present. Denies leaking of fluid.   The following portions of the patient's history were reviewed and updated as appropriate: allergies, current medications, past family history, past medical history, past social history, past surgical history and problem list. Problem list updated.  Objective:   Vitals:   07/07/17 0820  Weight: 222 lb 12.8 oz (101.1 kg)    Fetal Status: Fetal Heart Rate (bpm): 165 Fundal Height: 25 cm Movement: Present     General:  Alert, oriented and cooperative. Patient is in no acute distress.  Skin: Skin is warm and dry. No rash noted.   Cardiovascular: Normal heart rate noted  Respiratory: Normal respiratory effort, no problems with respiration noted  Abdomen: Soft, gravid, appropriate for gestational age.  Pain/Pressure: Present     Pelvic: Cervical exam deferred        Extremities: Normal range of motion.  Edema: Trace  Mental Status: Normal mood and affect. Normal behavior. Normal judgment and thought content.   Assessment and Plan:  Pregnancy: G2P1001 at 7069w6d  1. Supervision of normal intrauterine pregnancy in multigravida in first trimester -Doing well; no complaints. Reviewed urine results and pap from last visit.  -Circ discussed Preterm labor symptoms and general obstetric precautions including but not limited to vaginal bleeding, contractions, leaking of fluid and fetal movement were reviewed in detail  with the patient. Please refer to After Visit Summary for other counseling recommendations.  Return in about 1 month (around 08/04/2017) for obfu/ fasting 2hrgtt/28wk labs.  Future Appointments  Date Time Provider Department Center  08/04/2017  8:20 AM [facility]  08/04/2017 10:35 AM Crisoforo OxfordKooistra, Charlesetta GaribaldiKathryn Lorraine, CNM [facility]    Marylene LandKathryn Lorraine Kooistra, [place]

## 2017-07-07 NOTE — Progress Notes (Signed)
C/o pain in mid back.

## 2017-07-12 ENCOUNTER — Encounter: Payer: Self-pay | Admitting: Student

## 2017-07-13 ENCOUNTER — Encounter: Payer: Self-pay | Admitting: *Deleted

## 2017-07-13 ENCOUNTER — Encounter (HOSPITAL_COMMUNITY): Payer: Self-pay | Admitting: *Deleted

## 2017-07-13 ENCOUNTER — Inpatient Hospital Stay (HOSPITAL_COMMUNITY)
Admission: AD | Admit: 2017-07-13 | Discharge: 2017-07-13 | Disposition: A | Discharge status: Home / Self Care | Payer: Medicaid Other | Source: Ambulatory Visit | Attending: Family Medicine | Admitting: Family Medicine

## 2017-07-13 DIAGNOSIS — J029 Acute pharyngitis, unspecified: Secondary | ICD-10-CM | POA: Insufficient documentation

## 2017-07-13 DIAGNOSIS — O99512 Diseases of the respiratory system complicating pregnancy, second trimester: Secondary | ICD-10-CM | POA: Insufficient documentation

## 2017-07-13 DIAGNOSIS — O212 Late vomiting of pregnancy: Secondary | ICD-10-CM | POA: Insufficient documentation

## 2017-07-13 DIAGNOSIS — Z3A25 25 weeks gestation of pregnancy: Secondary | ICD-10-CM | POA: Diagnosis not present

## 2017-07-13 DIAGNOSIS — Z79899 Other long term (current) drug therapy: Secondary | ICD-10-CM | POA: Insufficient documentation

## 2017-07-13 DIAGNOSIS — O9989 Other specified diseases and conditions complicating pregnancy, childbirth and the puerperium: Secondary | ICD-10-CM

## 2017-07-13 LAB — URINALYSIS, ROUTINE W REFLEX MICROSCOPIC
Bilirubin Urine: NEGATIVE
GLUCOSE, UA: NEGATIVE mg/dL
HGB URINE DIPSTICK: NEGATIVE
KETONES UR: 5 mg/dL — AB
LEUKOCYTES UA: NEGATIVE
Nitrite: NEGATIVE
PH: 7 (ref 5.0–8.0)
Protein, ur: NEGATIVE mg/dL
Specific Gravity, Urine: 1.018 (ref 1.005–1.030)

## 2017-07-13 LAB — INFLUENZA PANEL BY PCR (TYPE A & B)
Influenza A By PCR: NEGATIVE
Influenza B By PCR: NEGATIVE

## 2017-07-13 MED ORDER — ACETAMINOPHEN 325 MG PO TABS
650.0000 mg | ORAL_TABLET | Freq: Once | ORAL | Status: AC
  Administered 2017-07-13: 650 mg via ORAL
  Filled 2017-07-13: qty 2

## 2017-07-13 NOTE — MAU Note (Signed)
Sore throat, body aches, cough- started yesterday.  Fever last night, her mom kept putting cool cloths on her, didn't actually check temp.  Her child has been sick with a cold.

## 2017-07-13 NOTE — MAU Note (Signed)
Pt started having vomiting & diarrhea around 0200 this morning, two episodes of diarrhea, vomited once.  Denies contractions, bleeding or LOF.  Reports good fetal movement.

## 2017-07-13 NOTE — Discharge Instructions (Signed)
You do not have the flu today.   Drink at least 8 8-oz glasses of water every day. Take Tylenol 325 mg 2 tablets by mouth every 4 hours if needed for pain. Keep your appointments in the office. Return if you develop a fever over 100.4

## 2017-07-13 NOTE — MAU Provider Note (Signed)
History     CSN: [id number]  Arrival date and time: 07/13/17 5284   First Provider Initiated Contact with Patient 07/13/17 [phone]      Chief Complaint  Patient presents with  . flu symptoms   HPI Olivia Park 31 y.o. [redacted]w[redacted]d  Goes to the Clinic at [facility] for prenatal care.  Next appointment is May 9.  Yesterday started having a sore throat and feeling achy.  Has felt hot through the night and thought she had a fever but did not check her temperature with a thermometer.  Is feeling the same as she was in late January when she went to the ER for flu-like symptoms.  Her son was being treated for the flu and she was prescribed Tamiflu but it caused her to have lots of vomiting and she did not finish the medication.  Review of the note - no flu lab test was done - was treated presumptively based on close contact with her son who was being treated for the flu.  She took Tylenol once yesterday but none today.  She vomited once last night and had soft stools (not watery) twice before coming to MAU.  Is not currently nauseated and has been drinking ginger ale here in MAU.  OB History    Gravida  2   Para  1   Term  1   Preterm      AB      Living  1     SAB      TAB      Ectopic      Multiple      Live Births              Past Medical History:  Diagnosis Date  . Medical history non-contributory     Past Surgical History:  Procedure Laterality Date  . NO PAST SURGERIES      No family history on file.  Social History   Tobacco Use  . Smoking status: Never Smoker  . Smokeless tobacco: Never Used  Substance Use Topics  . Alcohol use: No    Comment: socially  . Drug use: No    Allergies: No Known Allergies  Medications Prior to Admission  Medication Sig Dispense Refill Last Dose  . ondansetron (ZOFRAN) 8 MG tablet Take 1 tablet (8 mg total) by mouth every 8 (eight) hours as needed for nausea or vomiting. 30 tablet 2 Taking  . Prenatal  Multivit-Min-Fe-FA (PRENATAL VITAMINS) 0.8 MG tablet Take 1 tablet by mouth daily. (Patient not taking: Reported on 07/07/2017) 30 tablet 12 Not Taking  . Prenatal MV-Min-FA-Omega-3 (PRENATAL GUMMIES/DHA & FA PO) Take 2 tablets by mouth daily.   Taking  . Prenatal Vit-Fe Fumarate-FA (PREPLUS) 27-1 MG TABS Take 1 tablet by mouth daily.  1 Taking  . terconazole (TERAZOL 3) 0.8 % vaginal cream Place 1 applicator vaginally at bedtime. 20 g 0     Review of Systems  Constitutional:       Feels hot and achy  HENT: Positive for sore throat. Negative for congestion and rhinorrhea.   Gastrointestinal: Positive for nausea and vomiting. Negative for abdominal pain.       Soft, nto watery stools x 2  Genitourinary: Negative for vaginal bleeding and vaginal discharge.   Physical Exam   Blood pressure 140/80, pulse (!) 122, temperature 98.9 F (37.2 C), temperature source Oral, resp. rate 18, last menstrual period 01/23/2017, SpO2 100 %.  Physical Exam  Nursing note and  vitals reviewed. Constitutional: She is oriented to person, place, and time. She appears well-developed and well-nourished.  No fever.  HENT:  Head: Normocephalic.  Nose: Nose normal.  Mouth/Throat: No oropharyngeal exudate.  Pyarynx very mildly pink - barely pink, no enlargement of tonsils. No rhinits or congestion.  No cough.  Eyes: EOM are normal. Right eye exhibits no discharge. Left eye exhibits no discharge.  Neck: Neck supple.  Cardiovascular: Normal rate and regular rhythm.  On recheck, heart rate was 111 and blood pressure was in normal range on at least 2 rechecks.  Respiratory: Effort normal. No respiratory distress.  GI: Soft. There is no tenderness. There is no rebound and no guarding.  No contractions on fetal monitor. FHT shows moderate variability and no decelerations.  Musculoskeletal: Normal range of motion.  Neurological: She is alert and oriented to person, place, and time.  Skin: Skin is warm and dry.   Psychiatric: She has a normal mood and affect.    MAU Course  Procedures Results for orders placed or performed during the hospital encounter of 07/13/17 (from the past 24 hour(s))  Urinalysis, Routine w reflex microscopic     Status: Abnormal   Collection Time: 07/13/17  8:48 AM  Result Value Ref Range   Color, Urine YELLOW YELLOW   APPearance CLEAR CLEAR   Specific Gravity, Urine 1.018 1.005 - 1.030   pH 7.0 5.0 - 8.0   Glucose, UA NEGATIVE NEGATIVE mg/dL   Hgb urine dipstick NEGATIVE NEGATIVE   Bilirubin Urine NEGATIVE NEGATIVE   Ketones, ur 5 (A) NEGATIVE mg/dL   Protein, ur NEGATIVE NEGATIVE mg/dL   Nitrite NEGATIVE NEGATIVE   Leukocytes, UA NEGATIVE NEGATIVE  Influenza panel by PCR (type A & B)     Status: None   Collection Time: 07/13/17  9:20 AM  Result Value Ref Range   Influenza A By PCR NEGATIVE NEGATIVE   Influenza B By PCR NEGATIVE NEGATIVE    MDM Client is not dehydrated by UA results.  IV fluids are not needed and not desired by client.  Flu test is negative.  Reviewed BRAT diet and advised starting with liquids and advance slowly to solid food.  No oils, butter or fried foods.  Assessment and Plan  Nausea and vomiting at [redacted] weeks gestation Sore throat  Plan Flu test is negative. Take Tylenol 325 mg 2 tablets by mouth every 4 hours if needed for pain. Drink at least 8 8-oz glasses of water every day. Keep your appointments with your doctor. Given sheet on medications that are safe to take in pregnancy. Given a note to be out of work until Friday.  Arush Gatliff L Christina Gintz 07/13/2017, 9:36 AM

## 2017-07-15 ENCOUNTER — Encounter (HOSPITAL_COMMUNITY): Payer: Self-pay | Admitting: *Deleted

## 2017-07-15 ENCOUNTER — Inpatient Hospital Stay (HOSPITAL_COMMUNITY)
Admission: AD | Admit: 2017-07-15 | Discharge: 2017-07-15 | Disposition: A | Discharge status: Home / Self Care | Payer: Medicaid Other | Source: Ambulatory Visit | Attending: Family Medicine | Admitting: Family Medicine

## 2017-07-15 DIAGNOSIS — Z3A26 26 weeks gestation of pregnancy: Secondary | ICD-10-CM

## 2017-07-15 DIAGNOSIS — J069 Acute upper respiratory infection, unspecified: Secondary | ICD-10-CM | POA: Diagnosis not present

## 2017-07-15 DIAGNOSIS — R05 Cough: Secondary | ICD-10-CM | POA: Diagnosis present

## 2017-07-15 DIAGNOSIS — O99512 Diseases of the respiratory system complicating pregnancy, second trimester: Secondary | ICD-10-CM | POA: Diagnosis not present

## 2017-07-15 LAB — URINALYSIS, ROUTINE W REFLEX MICROSCOPIC
Bilirubin Urine: NEGATIVE
Glucose, UA: NEGATIVE mg/dL
Hgb urine dipstick: NEGATIVE
KETONES UR: 20 mg/dL — AB
Nitrite: NEGATIVE
PH: 5 (ref 5.0–8.0)
Protein, ur: NEGATIVE mg/dL
SPECIFIC GRAVITY, URINE: 1.024 (ref 1.005–1.030)

## 2017-07-15 LAB — GROUP A STREP BY PCR: Group A Strep by PCR: NOT DETECTED

## 2017-07-15 MED ORDER — PROMETHAZINE HCL 25 MG/ML IJ SOLN
25.0000 mg | Freq: Once | INTRAMUSCULAR | Status: AC
  Administered 2017-07-15: 25 mg via INTRAVENOUS
  Filled 2017-07-15: qty 1

## 2017-07-15 MED ORDER — PROMETHAZINE HCL 25 MG PO TABS: 25.0000 mg | ORAL_TABLET | Freq: Four times a day (QID) | ORAL | 0 refills | Status: DC | PRN

## 2017-07-15 MED ORDER — LACTATED RINGERS IV BOLUS
1000.0000 mL | Freq: Once | INTRAVENOUS | Status: AC
  Administered 2017-07-15: 1000 mL via INTRAVENOUS

## 2017-07-15 NOTE — MAU Note (Signed)
Pt states she has just been feeling worse ever since she left two days ago.  Has chills & hot flashes, still having vomiting & diarrhea.  Unable to hold down meds for sore throat.

## 2017-07-15 NOTE — MAU Provider Note (Signed)
History     CSN: [id number]  Arrival date and time: 07/15/17 1128   First Provider Initiated Contact with Patient 07/15/17 1429     Chief Complaint  Patient presents with  . Emesis  . Diarrhea  . Cough   HPI Olivia Park is a 32 y.o. G2P1001 at [redacted]w[redacted]d who presents stating she feels no better. She reports still having a cough, congestion, sore throat and chest pain. She rates the pain in her chest a 10/10. She states she tried tylenol yesterday morning and vomited, and then tried robitussin last night and vomited that as well. She has not tried anything for the symptoms today. She denies any abdominal pain, vaginal bleeding or discharge. Reports good fetal movement.    OB History    Gravida  2   Para  1   Term  1   Preterm      AB      Living  1     SAB      TAB      Ectopic      Multiple      Live Births              Past Medical History:  Diagnosis Date  . Medical history non-contributory     Past Surgical History:  Procedure Laterality Date  . NO PAST SURGERIES      History reviewed. No pertinent family history.  Social History   Tobacco Use  . Smoking status: Never Smoker  . Smokeless tobacco: Never Used  Substance Use Topics  . Alcohol use: No    Comment: socially  . Drug use: No    Allergies: No Known Allergies  Medications Prior to Admission  Medication Sig Dispense Refill Last Dose  . Prenatal MV-Min-FA-Omega-3 (PRENATAL GUMMIES/DHA & FA PO) Take 2 tablets by mouth daily.   Past Week at Unknown time  . ondansetron (ZOFRAN) 8 MG tablet Take 1 tablet (8 mg total) by mouth every 8 (eight) hours as needed for nausea or vomiting. (Patient not taking: Reported on 07/15/2017) 30 tablet 2 Not Taking at Unknown time  . terconazole (TERAZOL 3) 0.8 % vaginal cream Place 1 applicator vaginally at bedtime. (Patient not taking: Reported on 07/15/2017) 20 g 0 Not Taking at Unknown time    Review of Systems  Constitutional: Negative.  Negative  for fatigue and fever.  HENT: Positive for congestion, sneezing and sore throat.   Respiratory: Positive for cough. Negative for shortness of breath.   Cardiovascular: Negative.  Negative for chest pain.  Gastrointestinal: Negative.  Negative for abdominal pain, constipation, diarrhea, nausea and vomiting.  Genitourinary: Negative.  Negative for dysuria.  Neurological: Negative.  Negative for dizziness and headaches.   Physical Exam   Blood pressure 115/75, pulse (!) 113, temperature 98.6 F (37 C), temperature source Oral, resp. rate 18, height 5\' 5"  (1.651 m), weight 217 lb (98.4 kg), last menstrual period 01/23/2017, SpO2 100 %.  Physical Exam  Nursing note and vitals reviewed. Constitutional: She is oriented to person, place, and time. She appears well-developed and well-nourished. No distress.  HENT:  Head: Normocephalic.  Eyes: Pupils are equal, round, and reactive to light.  Cardiovascular: Normal rate, regular rhythm and normal heart sounds.  Respiratory: Effort normal and breath sounds normal. No respiratory distress.  GI: Soft. Bowel sounds are normal. She exhibits no distension. There is no tenderness.  Neurological: She is alert and oriented to person, place, and time.  Skin: Skin is warm and  dry.  Psychiatric: She has a normal mood and affect. Her behavior is normal. Judgment and thought content normal.   Fetal Tracing:  Baseline: 150 Variability: moderate Accels: 10x10 Decels: none  Toco: none  MAU Course  Procedures Results for orders placed or performed during the hospital encounter of 07/15/17 (from the past 24 hour(s))  Urinalysis, Routine w reflex microscopic     Status: Abnormal   Collection Time: 07/15/17 12:43 PM  Result Value Ref Range   Color, Urine YELLOW YELLOW   APPearance HAZY (A) CLEAR   Specific Gravity, Urine 1.024 1.005 - 1.030   pH 5.0 5.0 - 8.0   Glucose, UA NEGATIVE NEGATIVE mg/dL   Hgb urine dipstick NEGATIVE NEGATIVE   Bilirubin  Urine NEGATIVE NEGATIVE   Ketones, ur 20 (A) NEGATIVE mg/dL   Protein, ur NEGATIVE NEGATIVE mg/dL   Nitrite NEGATIVE NEGATIVE   Leukocytes, UA SMALL (A) NEGATIVE   RBC / HPF 0-5 0 - 5 RBC/hpf   WBC, UA 0-5 0 - 5 WBC/hpf   Bacteria, UA RARE (A) NONE SEEN   Squamous Epithelial / LPF 6-30 (A) NONE SEEN   Mucus PRESENT   Group A Strep by PCR     Status: None   Collection Time: 07/15/17  2:59 PM  Result Value Ref Range   Group A Strep by PCR NOT DETECTED NOT DETECTED   MDM UA ED EKG LR bolus Phenergan IV Group A Strep Screen  Assessment and Plan   1. Viral upper respiratory infection   2. [redacted] weeks gestation of pregnancy    -Discharge home in stable condition -OTC remedies for URI discussed -Patient [facility] -Patient may return to MAU as needed or if her condition were to change or worsen  Rolm BookbinderCaroline M Neill CNM 07/15/2017, 2:29 PM

## 2017-07-15 NOTE — MAU Note (Signed)
Pt was here Wednesday, been feeling worse since, vomiting, diarrhea and coughing, and throat and chest pain 10/10.

## 2017-07-15 NOTE — Discharge Instructions (Signed)

## 2017-08-04 ENCOUNTER — Other Ambulatory Visit: Payer: Medicaid Other

## 2017-08-04 ENCOUNTER — Encounter: Payer: Medicaid Other | Admitting: Student

## 2017-08-04 DIAGNOSIS — Z3481 Encounter for supervision of other normal pregnancy, first trimester: Secondary | ICD-10-CM

## 2017-08-05 ENCOUNTER — Encounter: Payer: Self-pay | Admitting: Certified Nurse Midwife

## 2017-08-05 ENCOUNTER — Ambulatory Visit (INDEPENDENT_AMBULATORY_CARE_PROVIDER_SITE_OTHER): Payer: Medicaid Other | Admitting: Certified Nurse Midwife

## 2017-08-05 ENCOUNTER — Encounter: Payer: Self-pay | Admitting: Family Medicine

## 2017-08-05 VITALS — BP 125/77 | HR 97 | Wt 223.1 lb

## 2017-08-05 DIAGNOSIS — Z23 Encounter for immunization: Secondary | ICD-10-CM | POA: Diagnosis not present

## 2017-08-05 DIAGNOSIS — Z3481 Encounter for supervision of other normal pregnancy, first trimester: Secondary | ICD-10-CM

## 2017-08-05 DIAGNOSIS — O24410 Gestational diabetes mellitus in pregnancy, diet controlled: Secondary | ICD-10-CM | POA: Insufficient documentation

## 2017-08-05 LAB — CBC
Hematocrit: 33.8 % — ABNORMAL LOW (ref 34.0–46.6)
Hemoglobin: 11.1 g/dL (ref 11.1–15.9)
MCH: 27.5 pg (ref 26.6–33.0)
MCHC: 32.8 g/dL (ref 31.5–35.7)
MCV: 84 fL (ref 79–97)
PLATELETS: 282 10*3/uL (ref 150–379)
RBC: 4.03 x10E6/uL (ref 3.77–5.28)
RDW: 13.7 % (ref 12.3–15.4)
WBC: 9.7 10*3/uL (ref 3.4–10.8)

## 2017-08-05 LAB — GLUCOSE TOLERANCE, 2 HOURS W/ 1HR
GLUCOSE, 1 HOUR: 170 mg/dL (ref 65–179)
GLUCOSE, 2 HOUR: 144 mg/dL (ref 65–152)
GLUCOSE, FASTING: 103 mg/dL — AB (ref 65–91)

## 2017-08-05 LAB — HIV ANTIBODY (ROUTINE TESTING W REFLEX): HIV SCREEN 4TH GENERATION: NONREACTIVE

## 2017-08-05 LAB — RPR: RPR Ser Ql: NONREACTIVE

## 2017-08-05 MED ORDER — ACCU-CHEK FASTCLIX LANCETS MISC: 1.0000 [IU] | Freq: Four times a day (QID) | 12 refills | Status: DC

## 2017-08-05 MED ORDER — ACCU-CHEK NANO SMARTVIEW W/DEVICE KIT: 1.0000 | PACK | 0 refills | Status: DC

## 2017-08-05 MED ORDER — ACCU-CHEK GUIDE W/DEVICE KIT: 1.0000 | PACK | Freq: Four times a day (QID) | 0 refills | Status: DC

## 2017-08-05 MED ORDER — GLUCOSE BLOOD VI STRP: ORAL_STRIP | 12 refills | Status: DC

## 2017-08-05 NOTE — Progress Notes (Incomplete)
   PRENATAL VISIT NOTE  Subjective:  Olivia Park is a 32 y.o. G2P1001 at 24w0dbeing seen today for ongoing prenatal care.  She is currently monitored for the following issues for this {Blank single:19197::"high-risk","low-risk"} pregnancy and has Supervision of normal intrauterine pregnancy in multigravida in first trimester; Uncertain dates, antepartum; UTI (urinary tract infection); and Diet controlled gestational diabetes mellitus (GDM) in third trimester on their problem list.  Patient reports {sx:14538}.  Contractions: Not present. Vag. Bleeding: None.  Movement: Present. Denies leaking of fluid.   The following portions of the patient's history were reviewed and updated as appropriate: allergies, current medications, past family history, past medical history, past social history, past surgical history and problem list. Problem list updated.  Objective:   Vitals:   08/05/17 0945  BP: 125/77  Pulse: 97  Weight: 223 lb 1.6 oz (101.2 kg)    Fetal Status: Fetal Heart Rate (bpm): 151 Fundal Height: 28 cm Movement: Present     General:  Alert, oriented and cooperative. Patient is in no acute distress.  Skin: Skin is warm and dry. No rash noted.   Cardiovascular: Normal heart rate noted  Respiratory: Normal respiratory effort, no problems with respiration noted  Abdomen: Soft, gravid, appropriate for gestational age.  Pain/Pressure: Absent     Pelvic: {Blank single:19197::"Cervical exam performed","Cervical exam deferred"}        Extremities: Normal range of motion.  Edema: None  Mental Status: Normal mood and affect. Normal behavior. Normal judgment and thought content.   Assessment and Plan:  Pregnancy: G2P1001 at 247w0d1. Supervision of normal intrauterine pregnancy in multigravida in first trimester *** - Tdap vaccine greater than or equal to 7yo IM  2. Diet controlled gestational diabetes mellitus (GDM) in third trimester *** - Referral to [facility] Diabetes  [facility] - ACCU-CHEK FASTCLIX LANCETS MISC; 1 Units by Percutaneous route 4 (four) times daily.  Dispense: 100 each; Refill: 12 - Blood Glucose Monitoring Suppl (ACCU-CHEK GUIDE) w/Device KIT; 1 kit by Subdermal route QID. Check blood sugars for fasting, and two hours after breakfast, lunch and dinner (4 checks daily)  Dispense: 1 kit; Refill: 0 - glucose blood (ACCU-CHEK GUIDE) test strip; Use as instructed  Dispense: 100 each; Refill: 12  {Blank single:19197::"Term","Preterm"} labor symptoms and general obstetric precautions including but not limited to vaginal bleeding, contractions, leaking of fluid and fetal movement were reviewed in detail with the patient. Please refer to After Visit Summary for other counseling recommendations.  Return in about 2 weeks (around 08/19/2017) for ROB.  Future Appointments  Date Time Provider [place]5/23/2019  9:55 AM LaJorje GuildNP [facility]  VeLajean ManesCNM

## 2017-08-05 NOTE — Progress Notes (Signed)
   PRENATAL VISIT NOTE  Subjective:  Olivia Park is a 32 y.o. G2P1001 at 81w0dbeing seen today for ongoing prenatal care.  She is currently monitored for the following issues for this low-risk pregnancy and has Supervision of normal intrauterine pregnancy in multigravida in first trimester; Uncertain dates, antepartum; UTI (urinary tract infection); and Diet controlled gestational diabetes mellitus (GDM) in third trimester on their problem list.  Patient reports no complaints.  Contractions: Not present. Vag. Bleeding: None.  Movement: Present. Denies leaking of fluid.   The following portions of the patient's history were reviewed and updated as appropriate: allergies, current medications, past family history, past medical history, past social history, past surgical history and problem list. Problem list updated.  Objective:   Vitals:   08/05/17 0945  BP: 125/77  Pulse: 97  Weight: 223 lb 1.6 oz (101.2 kg)    Fetal Status: Fetal Heart Rate (bpm): 151 Fundal Height: 28 cm Movement: Present     General:  Alert, oriented and cooperative. Patient is in no acute distress.  Skin: Skin is warm and dry. No rash noted.   Cardiovascular: Normal heart rate noted  Respiratory: Normal respiratory effort, no problems with respiration noted  Abdomen: Soft, gravid, appropriate for gestational age.  Pain/Pressure: Absent     Pelvic: Cervical exam deferred        Extremities: Normal range of motion.  Edema: None  Mental Status: Normal mood and affect. Normal behavior. Normal judgment and thought content.   Assessment and Plan:  Pregnancy: G2P1001 at 236w0d1. Supervision of normal intrauterine pregnancy in multigravida in first trimester -Patient doing well, no complaints.  -Reviewed prenatal appointment scheduled with patient, with appointments being every 2 weeks until 36weeks then going to weekly appointments.  - Tdap vaccine greater than or equal to 7yo IM  2. Diet controlled  gestational diabetes mellitus (GDM) in third trimester -Reviewed results of glucose testing with patient. Fasting blood sugar 103. Patient diagnosed with gestational diabetes.  -Educated on gestational diabetes and how to track blood sugars including logging sugars. Patient verbalizes understanding.  - Referral to Nutrition and Diabetes Services - ACCU-CHEK FASTCLIX LANCETS MISC; 1 Units by Percutaneous route 4 (four) times daily.  Dispense: 100 each; Refill: 12 - Blood Glucose Monitoring Suppl (ACCU-CHEK GUIDE) w/Device KIT; 1 kit by Subdermal route QID. Check blood sugars for fasting, and two hours after breakfast, lunch and dinner (4 checks daily)  Dispense: 1 kit; Refill: 0 - glucose blood (ACCU-CHEK GUIDE) test strip; Use as instructed  Dispense: 100 each; Refill: 12  Preterm labor symptoms and general obstetric precautions including but not limited to vaginal bleeding, contractions, leaking of fluid and fetal movement were reviewed in detail with the patient. Please refer to After Visit Summary for other counseling recommendations.  Return in about 2 weeks (around 08/19/2017) for ROB.  Future Appointments  Date Time Provider [place]5/23/2019  9:55 AM LaJorje GuildNP [facility]  VeLajean ManesCNM

## 2017-08-05 NOTE — Patient Instructions (Signed)

## 2017-08-08 ENCOUNTER — Encounter: Payer: Self-pay | Admitting: Student

## 2017-08-09 ENCOUNTER — Ambulatory Visit (INDEPENDENT_AMBULATORY_CARE_PROVIDER_SITE_OTHER): Payer: Medicaid Other

## 2017-08-09 ENCOUNTER — Other Ambulatory Visit (HOSPITAL_COMMUNITY)
Admission: RE | Admit: 2017-08-09 | Discharge: 2017-08-09 | Disposition: A | Discharge status: Home / Self Care | Payer: Medicaid Other | Source: Ambulatory Visit | Attending: Family Medicine | Admitting: Family Medicine

## 2017-08-09 DIAGNOSIS — Z202 Contact with and (suspected) exposure to infections with a predominantly sexual mode of transmission: Secondary | ICD-10-CM

## 2017-08-09 NOTE — Progress Notes (Signed)
Chart reviewed for nurse visit. Agree with plan of care.   Pincus Large, DO 08/09/2017 5:34 PM

## 2017-08-09 NOTE — Progress Notes (Signed)
Pt here for STD check, gave Swab & explained how to collect sample. Advised results will comeback and she will have access in My Chart. If comes back positive with something we will notify her to inform & send meds to pharmacy or here. Pt verbalized understanding.

## 2017-08-11 LAB — CERVICOVAGINAL ANCILLARY ONLY
BACTERIAL VAGINITIS: NEGATIVE
CANDIDA VAGINITIS: NEGATIVE
CHLAMYDIA, DNA PROBE: NEGATIVE
NEISSERIA GONORRHEA: NEGATIVE
TRICH (WINDOWPATH): NEGATIVE

## 2017-08-17 ENCOUNTER — Encounter: Payer: Self-pay | Admitting: Registered"

## 2017-08-17 ENCOUNTER — Ambulatory Visit (INDEPENDENT_AMBULATORY_CARE_PROVIDER_SITE_OTHER): Payer: Medicaid Other | Admitting: Student

## 2017-08-17 ENCOUNTER — Encounter: Payer: Medicaid Other | Attending: Obstetrics and Gynecology | Admitting: Registered"

## 2017-08-17 VITALS — BP 133/81 | HR 89 | Wt 231.9 lb

## 2017-08-17 DIAGNOSIS — Z713 Dietary counseling and surveillance: Secondary | ICD-10-CM | POA: Insufficient documentation

## 2017-08-17 DIAGNOSIS — O24410 Gestational diabetes mellitus in pregnancy, diet controlled: Secondary | ICD-10-CM

## 2017-08-17 DIAGNOSIS — Z3482 Encounter for supervision of other normal pregnancy, second trimester: Secondary | ICD-10-CM

## 2017-08-17 NOTE — Progress Notes (Signed)
Patient was seen on 08/17/17 for Gestational Diabetes self-management class at [facility]. The following learning objectives were met by the patient during this course:   States the definition of Gestational Diabetes  States why dietary management is important in controlling blood glucose  Describes the effects each nutrient has on blood glucose levels  Demonstrates ability to create a balanced meal plan  Demonstrates carbohydrate counting   States when to check blood glucose levels  Demonstrates proper blood glucose monitoring techniques  States the effect of stress and exercise on blood glucose levels  States the importance of limiting caffeine and abstaining from alcohol and smoking  Blood glucose monitor given: none  Patient instructed to monitor glucose levels: FBS: 60 - <95; 1 hour: <140; 2 hour: <120  Patient received handouts:  Nutrition Diabetes and Pregnancy, including carb counting list  Patient will be seen for follow-up as needed.

## 2017-08-17 NOTE — Progress Notes (Signed)
   PRENATAL VISIT NOTE  Subjective:  Olivia Park is a 32 y.o. G2P1001 at [redacted]w[redacted]d being seen today for ongoing prenatal care.  She is currently monitored for the following issues for this low-risk pregnancy and has Supervision of normal intrauterine pregnancy in multigravida in first trimester; Uncertain dates, antepartum; UTI (urinary tract infection); and Diet controlled gestational diabetes mellitus (GDM) in third trimester on their problem list.  Patient reports no bleeding, no contractions, no cramping and no leaking. Occasionally experiences round ligament pain and discomfort in pub symphysis.  Contractions: Not present. Vag. Bleeding: None.  Movement: Present. Denies leaking of fluid.   The following portions of the patient's history were reviewed and updated as appropriate: allergies, current medications, past family history, past medical history, past social history, past surgical history and problem list. Problem list updated.  Objective:   Vitals:   08/17/17 1409  BP: 133/81  Pulse: 89  Weight: 231 lb 14.4 oz (105.2 kg)    Fetal Status: Fetal Heart Rate (bpm): 151 Fundal Height: 31 cm Movement: Present     General:  Alert, oriented and cooperative. Patient is in no acute distress.  Skin: Skin is warm and dry. No rash noted.   Cardiovascular: Normal heart rate noted  Respiratory: Normal respiratory effort, no problems with respiration noted  Abdomen: Soft, gravid, appropriate for gestational age.  Pain/Pressure: Present     Pelvic: Cervical exam deferred        Extremities: Normal range of motion.  Edema: Trace  Mental Status: Normal mood and affect. Normal behavior. Normal judgment and thought content.   Assessment and Plan:  Pregnancy: G2P1001 at [redacted]w[redacted]d  1. Encounter for supervision of other normal pregnancy in second trimester - GDM, followup with MD in two weeks - Attended DM training and received equipment this morning - Did not bring blood glucose booklet to  appointment, discussed bringing it to every appointment - Consider abdominal support belt for worsening abdominal discomfort  Preterm labor symptoms and general obstetric precautions including but not limited to vaginal bleeding, contractions, leaking of fluid and fetal movement were reviewed in detail with the patient. Please refer to After Visit Summary for other counseling recommendations.  Return in about 2 weeks (around 08/31/2017).  Future Appointments  Date Time Provider Department Center  08/31/2017 10:15 AM Willodean Rosenthal, MD [facility]    [name], [place]  08/17/17 2:31 PM

## 2017-08-18 ENCOUNTER — Encounter: Payer: Medicaid Other | Admitting: Student

## 2017-08-31 ENCOUNTER — Ambulatory Visit (INDEPENDENT_AMBULATORY_CARE_PROVIDER_SITE_OTHER): Payer: Medicaid Other | Admitting: Obstetrics & Gynecology

## 2017-08-31 VITALS — BP 132/84 | HR 88 | Wt 236.4 lb

## 2017-08-31 DIAGNOSIS — N3000 Acute cystitis without hematuria: Secondary | ICD-10-CM

## 2017-08-31 DIAGNOSIS — Z3481 Encounter for supervision of other normal pregnancy, first trimester: Secondary | ICD-10-CM

## 2017-08-31 DIAGNOSIS — O24410 Gestational diabetes mellitus in pregnancy, diet controlled: Secondary | ICD-10-CM

## 2017-08-31 DIAGNOSIS — Z3483 Encounter for supervision of other normal pregnancy, third trimester: Secondary | ICD-10-CM

## 2017-08-31 DIAGNOSIS — Z3493 Encounter for supervision of normal pregnancy, unspecified, third trimester: Secondary | ICD-10-CM

## 2017-08-31 LAB — POCT URINALYSIS DIP (MANUAL ENTRY)
BILIRUBIN UA: NEGATIVE
Glucose, UA: NEGATIVE mg/dL
Ketones, POC UA: NEGATIVE mg/dL
NITRITE UA: NEGATIVE
PH UA: 6 (ref 5.0–8.0)
Protein Ur, POC: 30 mg/dL — AB
RBC UA: NEGATIVE
UROBILINOGEN UA: 0.2 U/dL

## 2017-08-31 LAB — POCT URINALYSIS DIP (DEVICE)
Bilirubin Urine: NEGATIVE
Glucose, UA: NEGATIVE mg/dL
HGB URINE DIPSTICK: NEGATIVE
Ketones, ur: NEGATIVE mg/dL
NITRITE: NEGATIVE
Protein, ur: 30 mg/dL — AB
Urobilinogen, UA: 0.2 mg/dL (ref 0.0–1.0)
pH: 6 (ref 5.0–8.0)

## 2017-08-31 MED ORDER — CEPHALEXIN 500 MG PO CAPS: 500.0000 mg | ORAL_CAPSULE | Freq: Two times a day (BID) | ORAL | 0 refills | Status: DC

## 2017-08-31 NOTE — Progress Notes (Signed)
   PRENATAL VISIT NOTE  Subjective:  Olivia Park is a 32 y.o. G2P1001 at 2456w5d being seen today for ongoing prenatal care.  She is currently monitored for the following issues for this high-risk pregnancy and has Supervision of normal intrauterine pregnancy in multigravida in first trimester; Uncertain dates, antepartum; UTI (urinary tract infection); and Diet controlled gestational diabetes mellitus (GDM) in third trimester on their problem list.  Patient reports dysuria.  Contractions: Not present. Vag. Bleeding: None.  Movement: Present. Denies leaking of fluid.   The following portions of the patient's history were reviewed and updated as appropriate: allergies, current medications, past family history, past medical history, past social history, past surgical history and problem list. Problem list updated.  Objective:   Vitals:   08/31/17 1033  BP: 132/84  Pulse: 88  Weight: 236 lb 6.4 oz (107.2 kg)    Fetal Status: Fetal Heart Rate (bpm): 152   Movement: Present     General:  Alert, oriented and cooperative. Patient is in no acute distress.  Skin: Skin is warm and dry. No rash noted.   Cardiovascular: Normal heart rate noted  Respiratory: Normal respiratory effort, no problems with respiration noted  Abdomen: Soft, gravid, appropriate for gestational age.  Pain/Pressure: Present     Pelvic: Cervical exam deferred        Extremities: Normal range of motion.  Edema: Trace  Mental Status: Normal mood and affect. Normal behavior. Normal judgment and thought content.   Assessment and Plan:  Pregnancy: G2P1001 at 4656w5d  1. Supervision of normal intrauterine pregnancy in multigravida in first trimester  2. Pregnancy with uncertain dates in third trimester  3. Diet controlled gestational diabetes mellitus (GDM) in third trimester Did not bring glc log Her fasting is usually 89 and her PP is usually ~118.    4. UTI Keflex 500mg  bid x 5 days  Preterm labor symptoms and  general obstetric precautions including but not limited to vaginal bleeding, contractions, leaking of fluid and fetal movement were reviewed in detail with the patient. Please refer to After Visit Summary for other counseling recommendations.  Return in about 2 weeks (around 09/14/2017).  No future appointments.  Willodean Rosenthalarolyn Harraway-Smith, MD

## 2017-08-31 NOTE — Patient Instructions (Signed)

## 2017-09-15 ENCOUNTER — Encounter: Payer: Self-pay | Admitting: Obstetrics and Gynecology

## 2017-09-19 ENCOUNTER — Ambulatory Visit (INDEPENDENT_AMBULATORY_CARE_PROVIDER_SITE_OTHER): Payer: Medicaid Other | Admitting: Obstetrics and Gynecology

## 2017-09-19 VITALS — BP 153/96 | HR 86 | Wt 244.7 lb

## 2017-09-19 DIAGNOSIS — Z3481 Encounter for supervision of other normal pregnancy, first trimester: Secondary | ICD-10-CM

## 2017-09-19 DIAGNOSIS — O139 Gestational [pregnancy-induced] hypertension without significant proteinuria, unspecified trimester: Secondary | ICD-10-CM | POA: Insufficient documentation

## 2017-09-19 DIAGNOSIS — O24410 Gestational diabetes mellitus in pregnancy, diet controlled: Secondary | ICD-10-CM

## 2017-09-19 DIAGNOSIS — O133 Gestational [pregnancy-induced] hypertension without significant proteinuria, third trimester: Secondary | ICD-10-CM

## 2017-09-19 NOTE — Patient Instructions (Addendum)
Third Trimester of Pregnancy The third trimester is from week 28 through week 40 (months 7 through 9). The third trimester is a time when the unborn baby (fetus) is growing rapidly. At the end of the ninth month, the fetus is about 20 inches in length and weighs 6-10 pounds. Body changes during your third trimester Your body will continue to go through many changes during pregnancy. The changes vary from woman to woman. During the third trimester:  Your weight will continue to increase. You can expect to gain 25-35 pounds (11-16 kg) by the end of the pregnancy.  You may begin to get stretch marks on your hips, abdomen, and breasts.  You may urinate more often because the fetus is moving lower into your pelvis and pressing on your bladder.  You may develop or continue to have heartburn. This is caused by increased hormones that slow down muscles in the digestive tract.  You may develop or continue to have constipation because increased hormones slow digestion and cause the muscles that push waste through your intestines to relax.  You may develop hemorrhoids. These are swollen veins (varicose veins) in the rectum that can itch or be painful.  You may develop swollen, bulging veins (varicose veins) in your legs.  You may have increased body aches in the pelvis, back, or thighs. This is due to weight gain and increased hormones that are relaxing your joints.  You may have changes in your hair. These can include thickening of your hair, rapid growth, and changes in texture. Some women also have hair loss during or after pregnancy, or hair that feels dry or thin. Your hair will most likely return to normal after your baby is born.  Your breasts will continue to grow and they will continue to become tender. A yellow fluid (colostrum) may leak from your breasts. This is the first milk you are producing for your baby.  Your belly button may stick out.  You may notice more swelling in your hands,  face, or ankles.  You may have increased tingling or numbness in your hands, arms, and legs. The skin on your belly may also feel numb.  You may feel short of breath because of your expanding uterus.  You may have more problems sleeping. This can be caused by the size of your belly, increased need to urinate, and an increase in your body's metabolism.  You may notice the fetus "dropping," or moving lower in your abdomen (lightening).  You may have increased vaginal discharge.  You may notice your joints feel loose and you may have pain around your pelvic bone.  What to expect at prenatal visits You will have prenatal exams every 2 weeks until week 36. Then you will have weekly prenatal exams. During a routine prenatal visit:  You will be weighed to make sure you and the baby are growing normally.  Your blood pressure will be taken.  Your abdomen will be measured to track your baby's growth.  The fetal heartbeat will be listened to.  Any test results from the previous visit will be discussed.  You may have a cervical check near your due date to see if your cervix has softened or thinned (effaced).  You will be tested for Group B streptococcus. This happens between 35 and 37 weeks.  Your health care provider may ask you:  What your birth plan is.  How you are feeling.  If you are feeling the baby move.  If you have had   any abnormal symptoms, such as leaking fluid, bleeding, severe headaches, or abdominal cramping.  If you are using any tobacco products, including cigarettes, chewing tobacco, and electronic cigarettes.  If you have any questions.  Other tests or screenings that may be performed during your third trimester include:  Blood tests that check for low iron levels (anemia).  Fetal testing to check the health, activity level, and growth of the fetus. Testing is done if you have certain medical conditions or if there are problems during the  pregnancy.  Nonstress test (NST). This test checks the health of your baby to make sure there are no signs of problems, such as the baby not getting enough oxygen. During this test, a belt is placed around your belly. The baby is made to move, and its heart rate is monitored during movement.  What is false labor? False labor is a condition in which you feel small, irregular tightenings of the muscles in the womb (contractions) that usually go away with rest, changing position, or drinking water. These are called Braxton Hicks contractions. Contractions may last for hours, days, or even weeks before true labor sets in. If contractions come at regular intervals, become more frequent, increase in intensity, or become painful, you should see your health care provider. What are the signs of labor?  Abdominal cramps.  Regular contractions that start at 10 minutes apart and become stronger and more frequent with time.  Contractions that start on the top of the uterus and spread down to the lower abdomen and back.  Increased pelvic pressure and dull back pain.  A watery or bloody mucus discharge that comes from the vagina.  Leaking of amniotic fluid. This is also known as your "water breaking." It could be a slow trickle or a gush. Let your health care provider know if it has a color or strange odor. If you have any of these signs, call your health care provider right away, even if it is before your due date. Follow these instructions at home: Medicines  Follow your health care provider's instructions regarding medicine use. Specific medicines may be either safe or unsafe to take during pregnancy.  Take a prenatal vitamin that contains at least 600 micrograms (mcg) of folic acid.  If you develop constipation, try taking a stool softener if your health care provider approves. Eating and drinking  Eat a balanced diet that includes fresh fruits and vegetables, whole grains, good sources of protein  such as meat, eggs, or tofu, and low-fat dairy. Your health care provider will help you determine the amount of weight gain that is right for you.  Avoid raw meat and uncooked cheese. These carry germs that can cause birth defects in the baby.  If you have low calcium intake from food, talk to your health care provider about whether you should take a daily calcium supplement.  Eat four or five small meals rather than three large meals a day.  Limit foods that are high in fat and processed sugars, such as fried and sweet foods.  To prevent constipation: ? Drink enough fluid to keep your urine clear or pale yellow. ? Eat foods that are high in fiber, such as fresh fruits and vegetables, whole grains, and beans. Activity  Exercise only as directed by your health care provider. Most women can continue their usual exercise routine during pregnancy. Try to exercise for 30 minutes at least 5 days a week. Stop exercising if you experience uterine contractions.  Avoid heavy   lifting.  Do not exercise in extreme heat or humidity, or at high altitudes.  Wear low-heel, comfortable shoes.  Practice good posture.  You may continue to have sex unless your health care provider tells you otherwise. Relieving pain and discomfort  Take frequent breaks and rest with your legs elevated if you have leg cramps or low back pain.  Take warm sitz baths to soothe any pain or discomfort caused by hemorrhoids. Use hemorrhoid cream if your health care provider approves.  Wear a good support bra to prevent discomfort from breast tenderness.  If you develop varicose veins: ? Wear support pantyhose or compression stockings as told by your healthcare provider. ? Elevate your feet for 15 minutes, 3-4 times a day. Prenatal care  Write down your questions. Take them to your prenatal visits.  Keep all your prenatal visits as told by your health care provider. This is important. Safety  Wear your seat belt at  all times when driving.  Make a list of emergency phone numbers, including numbers for family, friends, the hospital, and police and fire departments. General instructions  Avoid cat litter boxes and soil used by cats. These carry germs that can cause birth defects in the baby. If you have a cat, ask someone to clean the litter box for you.  Do not travel far distances unless it is absolutely necessary and only with the approval of your health care provider.  Do not use hot tubs, steam rooms, or saunas.  Do not drink alcohol.  Do not use any products that contain nicotine or tobacco, such as cigarettes and e-cigarettes. If you need help quitting, ask your health care provider.  Do not use any medicinal herbs or unprescribed drugs. These chemicals affect the formation and growth of the baby.  Do not douche or use tampons or scented sanitary pads.  Do not cross your legs for long periods of time.  To prepare for the arrival of your baby: ? Take prenatal classes to understand, practice, and ask questions about labor and delivery. ? Make a trial run to the hospital. ? Visit the hospital and tour the maternity area. ? Arrange for maternity or paternity leave through employers. ? Arrange for family and friends to take care of pets while you are in the hospital. ? Purchase a rear-facing car seat and make sure you know how to install it in your car. ? Pack your hospital bag. ? Prepare the baby's nursery. Make sure to remove all pillows and stuffed animals from the baby's crib to prevent suffocation.  Visit your dentist if you have not gone during your pregnancy. Use a soft toothbrush to brush your teeth and be gentle when you floss. Contact a health care provider if:  You are unsure if you are in labor or if your water has broken.  You become dizzy.  You have mild pelvic cramps, pelvic pressure, or nagging pain in your abdominal area.  You have lower back pain.  You have persistent  nausea, vomiting, or diarrhea.  You have an unusual or bad smelling vaginal discharge.  You have pain when you urinate. Get help right away if:  Your water breaks before 37 weeks.  You have regular contractions less than 5 minutes apart before 37 weeks.  You have a fever.  You are leaking fluid from your vagina.  You have spotting or bleeding from your vagina.  You have severe abdominal pain or cramping.  You have rapid weight loss or weight gain.    You have shortness of breath with chest pain.  You notice sudden or extreme swelling of your face, hands, ankles, feet, or legs.  Your baby makes fewer than 10 movements in 2 hours.  You have severe headaches that do not go away when you take medicine.  You have vision changes. Summary  The third trimester is from week 28 through week 40, months 7 through 9. The third trimester is a time when the unborn baby (fetus) is growing rapidly.  During the third trimester, your discomfort may increase as you and your baby continue to gain weight. You may have abdominal, leg, and back pain, sleeping problems, and an increased need to urinate.  During the third trimester your breasts will keep growing and they will continue to become tender. A yellow fluid (colostrum) may leak from your breasts. This is the first milk you are producing for your baby.  False labor is a condition in which you feel small, irregular tightenings of the muscles in the womb (contractions) that eventually go away. These are called Braxton Hicks contractions. Contractions may last for hours, days, or even weeks before true labor sets in.  Signs of labor can include: abdominal cramps; regular contractions that start at 10 minutes apart and become stronger and more frequent with time; watery or bloody mucus discharge that comes from the vagina; increased pelvic pressure and dull back pain; and leaking of amniotic fluid. This information is not intended to replace advice  given to you by your health care provider. Make sure you discuss any questions you have with your health care provider. Document Released: 03/09/2001 Document Revised: 08/21/2015 Document Reviewed: 05/16/2012 Elsevier Interactive Patient Education  2017 [employer]  Hypertension During Pregnancy Hypertension is also called high blood pressure. High blood pressure means that the force of your blood moving in your body is too strong. When you are pregnant, this condition should be watched carefully. It can cause problems for you and your baby. Follow these instructions at home: Eating and drinking  Drink enough fluid to keep your pee (urine) clear or pale yellow.  Eat healthy foods that are low in salt (sodium). ? Do not add salt to your food. ? Check labels on foods and drinks to see much salt is in them. Look on the label where you see "Sodium." Lifestyle  Do not use any products that contain nicotine or tobacco, such as cigarettes and e-cigarettes. If you need help quitting, ask your doctor.  Do not use alcohol.  Avoid caffeine.  Avoid stress. Rest and get plenty of sleep. General instructions  Take over-the-counter and prescription medicines only as told by your doctor.  While lying down, lie on your left side. This keeps pressure off your baby.  While sitting or lying down, raise (elevate) your feet. Try putting some pillows under your lower legs.  Exercise regularly. Ask your doctor what kinds of exercise are best for you.  Keep all prenatal and follow-up visits as told by your doctor. This is important. Contact a doctor if:  You have symptoms that your doctor told you to watch for, such as: ? Fever. ? Throwing up (vomiting). ? Headache. Get help right away if:  You have very bad pain in your belly (abdomen).  You are throwing up, and this does not get better with treatment.  You suddenly get swelling in your hands, ankles, or face.  You gain 4 lb (1.8 kg) or  more in 1 week.  You get bleeding from   your vagina.  You have blood in your pee.  You do not feel your baby moving as much as normal.  You have a change in vision.  You have muscle twitching or sudden tightening (spasms).  You have trouble breathing.  Your lips or fingernails turn blue. This information is not intended to replace advice given to you by your health care provider. Make sure you discuss any questions you have with your health care provider. Document Released: 04/17/2010 Document Revised: 11/25/2015 Document Reviewed: 11/25/2015 Elsevier Interactive Patient Education  2018 [employer] Inc.  

## 2017-09-19 NOTE — Progress Notes (Signed)
Subjective:  Olivia Park is a 32 y.o. G2P1001 at 40w3dbeing seen today for ongoing prenatal care.  She is currently monitored for the following issues for this high-risk pregnancy and has Supervision of normal intrauterine pregnancy in multigravida in first trimester; Diet controlled gestational diabetes mellitus (GDM) in third trimester; and Gestational hypertension on their problem list.  Patient reports occ BH, occ Ha relieved with Tylenol, no visual changes or epigastric pain. .  Contractions: Irregular. Vag. Bleeding: None.  Movement: Present. Denies leaking of fluid.   The following portions of the patient's history were reviewed and updated as appropriate: allergies, current medications, past family history, past medical history, past social history, past surgical history and problem list. Problem list updated.  Objective:   Vitals:   09/19/17 1317 09/19/17 1322  BP: (!) 144/99 (!) 153/96  Pulse: 86   Weight: 244 lb 11.2 oz (111 kg)     Fetal Status: Fetal Heart Rate (bpm): 154   Movement: Present     General:  Alert, oriented and cooperative. Patient is in no acute distress.  Skin: Skin is warm and dry. No rash noted.   Cardiovascular: Normal heart rate noted  Respiratory: Normal respiratory effort, no problems with respiration noted  Abdomen: Soft, gravid, appropriate for gestational age. Pain/Pressure: Present     Pelvic:  Cervical exam deferred        Extremities: Normal range of motion.  Edema: Mild pitting, slight indentation  Mental Status: Normal mood and affect. Normal behavior. Normal judgment and thought content.   Urinalysis:      Assessment and Plan:  Pregnancy: G2P1001 at 341w3d1. Supervision of normal intrauterine pregnancy in multigravida in first trimester Stable GBS next visit  2. Diet controlled gestational diabetes mellitus (GDM) in third trimester Did not bring CBG log but reports CGB's in goal range Will check A1c Importance of bringing CBG  log to OB visits stressed - [place] OB FOLLOW UP; Future  3. Gestational hypertension, third trimester BP elevated, not severe range, no S/Sx of PEC S/Sx of PEC reviewed  Will check labs Bring back on Wednesday for BP check - CBC - Comp Met (CMET) - Protein / creatinine ratio, urine  Preterm labor symptoms and general obstetric precautions including but not limited to vaginal bleeding, contractions, leaking of fluid and fetal movement were reviewed in detail with the patient. Please refer to After Visit Summary for other counseling recommendations.  Return in about 1 week (around 09/26/2017) for OB visit.   ErChancy MilroyMD

## 2017-09-19 NOTE — Progress Notes (Signed)
OB f/u US scheduled for June 25th @ 1030.  Pt notified.

## 2017-09-20 ENCOUNTER — Encounter (HOSPITAL_COMMUNITY): Payer: Self-pay

## 2017-09-20 ENCOUNTER — Ambulatory Visit (HOSPITAL_COMMUNITY)
Admission: RE | Admit: 2017-09-20 | Discharge: 2017-09-20 | Disposition: A | Discharge status: Home / Self Care | Payer: Medicaid Other | Source: Ambulatory Visit | Attending: Obstetrics and Gynecology | Admitting: Obstetrics and Gynecology

## 2017-09-20 DIAGNOSIS — Z3A35 35 weeks gestation of pregnancy: Secondary | ICD-10-CM | POA: Insufficient documentation

## 2017-09-20 DIAGNOSIS — O24410 Gestational diabetes mellitus in pregnancy, diet controlled: Secondary | ICD-10-CM

## 2017-09-20 DIAGNOSIS — O133 Gestational [pregnancy-induced] hypertension without significant proteinuria, third trimester: Secondary | ICD-10-CM | POA: Insufficient documentation

## 2017-09-20 DIAGNOSIS — O99213 Obesity complicating pregnancy, third trimester: Secondary | ICD-10-CM

## 2017-09-20 LAB — CBC
HEMATOCRIT: 32 % — AB (ref 34.0–46.6)
Hemoglobin: 10.6 g/dL — ABNORMAL LOW (ref 11.1–15.9)
MCH: 27.2 pg (ref 26.6–33.0)
MCHC: 33.1 g/dL (ref 31.5–35.7)
MCV: 82 fL (ref 79–97)
PLATELETS: 201 10*3/uL (ref 150–450)
RBC: 3.89 x10E6/uL (ref 3.77–5.28)
RDW: 14.2 % (ref 12.3–15.4)
WBC: 8.2 10*3/uL (ref 3.4–10.8)

## 2017-09-20 LAB — PROTEIN / CREATININE RATIO, URINE
Creatinine, Urine: 218.6 mg/dL
PROTEIN UR: 53.4 mg/dL
Protein/Creat Ratio: 244 mg/g creat — ABNORMAL HIGH (ref 0–200)

## 2017-09-20 LAB — COMPREHENSIVE METABOLIC PANEL
ALT: 15 IU/L (ref 0–32)
AST: 20 IU/L (ref 0–40)
Albumin/Globulin Ratio: 1.3 (ref 1.2–2.2)
Albumin: 3.3 g/dL — ABNORMAL LOW (ref 3.5–5.5)
Alkaline Phosphatase: 101 IU/L (ref 39–117)
BUN/Creatinine Ratio: 15 (ref 9–23)
BUN: 9 mg/dL (ref 6–20)
CHLORIDE: 106 mmol/L (ref 96–106)
CO2: 20 mmol/L (ref 20–29)
CREATININE: 0.61 mg/dL (ref 0.57–1.00)
Calcium: 9.2 mg/dL (ref 8.7–10.2)
GFR calc Af Amer: 140 mL/min/{1.73_m2} (ref >59)
GFR calc non Af Amer: 121 mL/min/{1.73_m2} (ref >59)
GLUCOSE: 94 mg/dL (ref 65–99)
Globulin, Total: 2.5 g/dL (ref 1.5–4.5)
Potassium: 4.2 mmol/L (ref 3.5–5.2)
Sodium: 138 mmol/L (ref 134–144)
Total Protein: 5.8 g/dL — ABNORMAL LOW (ref 6.0–8.5)

## 2017-09-20 LAB — HEMOGLOBIN A1C
ESTIMATED AVERAGE GLUCOSE: 111 mg/dL
HEMOGLOBIN A1C: 5.5 % (ref 4.8–5.6)

## 2017-09-21 ENCOUNTER — Inpatient Hospital Stay (HOSPITAL_COMMUNITY)
Admission: AD | Admit: 2017-09-21 | Discharge: 2017-09-24 | DRG: 807 | Disposition: A | Discharge status: Home / Self Care | Payer: Medicaid Other | Attending: Obstetrics and Gynecology | Admitting: Obstetrics and Gynecology

## 2017-09-21 ENCOUNTER — Ambulatory Visit (INDEPENDENT_AMBULATORY_CARE_PROVIDER_SITE_OTHER): Payer: Medicaid Other | Admitting: *Deleted

## 2017-09-21 ENCOUNTER — Encounter (HOSPITAL_COMMUNITY): Payer: Self-pay

## 2017-09-21 ENCOUNTER — Other Ambulatory Visit: Payer: Self-pay

## 2017-09-21 VITALS — BP 160/100 | HR 88

## 2017-09-21 DIAGNOSIS — Z3A35 35 weeks gestation of pregnancy: Secondary | ICD-10-CM | POA: Diagnosis not present

## 2017-09-21 DIAGNOSIS — O24410 Gestational diabetes mellitus in pregnancy, diet controlled: Secondary | ICD-10-CM

## 2017-09-21 DIAGNOSIS — O1414 Severe pre-eclampsia complicating childbirth: Principal | ICD-10-CM | POA: Diagnosis present

## 2017-09-21 DIAGNOSIS — O1493 Unspecified pre-eclampsia, third trimester: Secondary | ICD-10-CM | POA: Diagnosis present

## 2017-09-21 DIAGNOSIS — O24420 Gestational diabetes mellitus in childbirth, diet controlled: Secondary | ICD-10-CM | POA: Diagnosis present

## 2017-09-21 DIAGNOSIS — O133 Gestational [pregnancy-induced] hypertension without significant proteinuria, third trimester: Secondary | ICD-10-CM

## 2017-09-21 DIAGNOSIS — Z3481 Encounter for supervision of other normal pregnancy, first trimester: Secondary | ICD-10-CM

## 2017-09-21 DIAGNOSIS — O1410 Severe pre-eclampsia, unspecified trimester: Secondary | ICD-10-CM | POA: Diagnosis present

## 2017-09-21 DIAGNOSIS — O1494 Unspecified pre-eclampsia, complicating childbirth: Secondary | ICD-10-CM | POA: Diagnosis present

## 2017-09-21 HISTORY — DX: Gestational diabetes mellitus in pregnancy, unspecified control: O24.419

## 2017-09-21 HISTORY — DX: Gestational (pregnancy-induced) hypertension without significant proteinuria, unspecified trimester: O13.9

## 2017-09-21 LAB — COMPREHENSIVE METABOLIC PANEL
ALK PHOS: 103 U/L (ref 38–126)
ALT: 17 U/L (ref 0–44)
AST: 23 U/L (ref 15–41)
Albumin: 3 g/dL — ABNORMAL LOW (ref 3.5–5.0)
Anion gap: 9 (ref 5–15)
BUN: 11 mg/dL (ref 6–20)
CALCIUM: 9.1 mg/dL (ref 8.9–10.3)
CO2: 21 mmol/L — AB (ref 22–32)
CREATININE: 0.74 mg/dL (ref 0.44–1.00)
Chloride: 104 mmol/L (ref 98–111)
GFR calc non Af Amer: >60 mL/min (ref >60)
GLUCOSE: 76 mg/dL (ref 70–99)
Potassium: 4.1 mmol/L (ref 3.5–5.1)
SODIUM: 134 mmol/L — AB (ref 135–145)
Total Bilirubin: 0.4 mg/dL (ref 0.3–1.2)
Total Protein: 6.3 g/dL — ABNORMAL LOW (ref 6.5–8.1)

## 2017-09-21 LAB — CBC
HCT: 34.5 % — ABNORMAL LOW (ref 36.0–46.0)
HCT: 34.2 % — ABNORMAL LOW (ref 36.0–46.0)
Hemoglobin: 11.3 g/dL — ABNORMAL LOW (ref 12.0–15.0)
Hemoglobin: 11.1 g/dL — ABNORMAL LOW (ref 12.0–15.0)
MCH: 27.8 pg (ref 26.0–34.0)
MCH: 27.7 pg (ref 26.0–34.0)
MCHC: 32.8 g/dL (ref 30.0–36.0)
MCHC: 32.5 g/dL (ref 30.0–36.0)
MCV: 84.8 fL (ref 78.0–100.0)
MCV: 85.3 fL (ref 78.0–100.0)
Platelets: 209 10*3/uL (ref 150–400)
Platelets: 220 10*3/uL (ref 150–400)
RBC: 4.07 MIL/uL (ref 3.87–5.11)
RBC: 4.01 MIL/uL (ref 3.87–5.11)
RDW: 13.7 % (ref 11.5–15.5)
RDW: 13.8 % (ref 11.5–15.5)
WBC: 9 10*3/uL (ref 4.0–10.5)
WBC: 10.1 10*3/uL (ref 4.0–10.5)

## 2017-09-21 LAB — TYPE AND SCREEN
ABO/RH(D): O POS
ANTIBODY SCREEN: NEGATIVE

## 2017-09-21 LAB — GLUCOSE, CAPILLARY: GLUCOSE-CAPILLARY: 125 mg/dL — AB (ref 70–99)

## 2017-09-21 LAB — CREATININE, SERUM
Creatinine, Ser: 0.71 mg/dL (ref 0.44–1.00)
GFR calc Af Amer: >60 mL/min (ref >60)
GFR calc non Af Amer: >60 mL/min (ref >60)

## 2017-09-21 LAB — PROTEIN / CREATININE RATIO, URINE
CREATININE, URINE: 127 mg/dL
Protein Creatinine Ratio: 0.38 mg/mg{Cre} — ABNORMAL HIGH (ref 0.00–0.15)
Total Protein, Urine: 48 mg/dL

## 2017-09-21 LAB — ABO/RH: ABO/RH(D): O POS

## 2017-09-21 MED ORDER — SODIUM CHLORIDE 0.9% FLUSH: 3.0000 mL | INTRAVENOUS | Status: DC | PRN

## 2017-09-21 MED ORDER — LABETALOL HCL 5 MG/ML IV SOLN
20.0000 mg | INTRAVENOUS | Status: DC | PRN
  Administered 2017-09-21: 20 mg via INTRAVENOUS
  Filled 2017-09-21: qty 4

## 2017-09-21 MED ORDER — ZOLPIDEM TARTRATE 5 MG PO TABS
5.0000 mg | ORAL_TABLET | Freq: Every evening | ORAL | Status: DC | PRN
  Administered 2017-09-21: 5 mg via ORAL
  Filled 2017-09-21: qty 1

## 2017-09-21 MED ORDER — SODIUM CHLORIDE 0.9% FLUSH
3.0000 mL | Freq: Two times a day (BID) | INTRAVENOUS | Status: DC
  Administered 2017-09-21: 3 mL via INTRAVENOUS

## 2017-09-21 MED ORDER — CALCIUM CARBONATE ANTACID 500 MG PO CHEW: 2.0000 | CHEWABLE_TABLET | ORAL | Status: DC | PRN

## 2017-09-21 MED ORDER — SODIUM CHLORIDE 0.9 % IV SOLN: 250.0000 mL | INTRAVENOUS | Status: DC | PRN

## 2017-09-21 MED ORDER — ENOXAPARIN SODIUM 60 MG/0.6ML ~~LOC~~ SOLN
50.0000 mg | SUBCUTANEOUS | Status: DC
  Administered 2017-09-21: 50 mg via SUBCUTANEOUS
  Filled 2017-09-21: qty 0.6

## 2017-09-21 MED ORDER — HYDRALAZINE HCL 20 MG/ML IJ SOLN: 10.0000 mg | Freq: Once | INTRAMUSCULAR | Status: DC | PRN

## 2017-09-21 MED ORDER — DOCUSATE SODIUM 100 MG PO CAPS
100.0000 mg | ORAL_CAPSULE | Freq: Every day | ORAL | Status: DC
  Filled 2017-09-21 (×2): qty 1

## 2017-09-21 MED ORDER — ACETAMINOPHEN 500 MG PO TABS
1000.0000 mg | ORAL_TABLET | Freq: Once | ORAL | Status: AC
  Administered 2017-09-21: 1000 mg via ORAL
  Filled 2017-09-21: qty 2

## 2017-09-21 MED ORDER — PRENATAL MULTIVITAMIN CH
1.0000 | ORAL_TABLET | Freq: Every day | ORAL | Status: DC
  Filled 2017-09-21: qty 1

## 2017-09-21 MED ORDER — ACETAMINOPHEN 325 MG PO TABS
650.0000 mg | ORAL_TABLET | ORAL | Status: DC | PRN
  Administered 2017-09-21: 650 mg via ORAL
  Filled 2017-09-21: qty 2

## 2017-09-21 MED ORDER — BETAMETHASONE SOD PHOS & ACET 6 (3-3) MG/ML IJ SUSP
12.0000 mg | INTRAMUSCULAR | Status: DC
  Administered 2017-09-21: 12 mg via INTRAMUSCULAR
  Filled 2017-09-21: qty 2

## 2017-09-21 NOTE — MAU Note (Signed)
Pt presents to MAU from clinic for high blood pressure. Pt has been coming in for the last three days for blood pressure check, complains of headache.

## 2017-09-21 NOTE — Progress Notes (Signed)
Here for bp check.C/o having intermittent headaches since Monday =6, takes tylenol but doesn't want to take a lot of tylenol. States tylenol helps a little. Denies eipgastic pain or visual disturbances. States swelling about the same as Monday=1+. \ Reported bp and patient asssesment to Dr. Vergie LivingPickens and ordered to take to MAU for evaluation. Report called to MAU RN [place] and patient taken to MAU.

## 2017-09-21 NOTE — H&P (Signed)
History   CSN: [id number]  Arrival date and time: 09/21/17 1401   First Provider Initiated Contact with Patient 09/21/17 1444     Admission diagnosis: 1. Pre-eclampsia in third trimester   2. [redacted] weeks gestation of pregnancy   3. Diet controlled gestational diabetes mellitus (GDM) in third trimester       HPI  Olivia Park is a 32 y.o. G2P1001 at 65w5dwho presents with headache and hypertension. Diagnosed with gestational hypertension on Monday. Returned to clinic today for BP check & was sent up here for PEC evaluation. Denies history of hypertension. Reports recurrent headaches since Monday that she has been taking tylenol for. Has not treated headache today. Frontal headache that feels like crushing/throbbing. Nothing makes pain better or worse. No photophobia. Denies visual disturbance, chest pain, n/v, or epigastric pain.  In MAU received IV antihypertensive x 1 dose for severe range BP. Tx of headache with improvement in symptoms. New proteinuria with PCR of 0.38 -- up from normal results on Monday.           OB History    Gravida  2   Para  1   Term  1   Preterm      AB      Living  1     SAB      TAB      Ectopic      Multiple      Live Births                  Past Medical History:  Diagnosis Date  . Gestational diabetes   . Pregnancy induced hypertension          Past Surgical History:  Procedure Laterality Date  . NO PAST SURGERIES      History reviewed. No pertinent family history.  Social History        Tobacco Use  . Smoking status: Never Smoker  . Smokeless tobacco: Never Used  Substance Use Topics  . Alcohol use: No    Comment: socially  . Drug use: No    Allergies: No Known Allergies         Medications Prior to Admission  Medication Sig Dispense Refill Last Dose  . ACCU-CHEK FASTCLIX LANCETS MISC 1 Units by Percutaneous route 4 (four) times daily. 100 each 12 Taking  . Blood Glucose  Monitoring Suppl (ACCU-CHEK GUIDE) w/Device KIT 1 kit by Subdermal route QID. Check blood sugars for fasting, and two hours after breakfast, lunch and dinner (4 checks daily) 1 kit 0 Taking  . glucose blood (ACCU-CHEK GUIDE) test strip Use as instructed 100 each 12 Taking  . Prenatal MV-Min-FA-Omega-3 (PRENATAL GUMMIES/DHA & FA PO) Take 2 tablets by mouth daily.   Taking  . promethazine (PHENERGAN) 25 MG tablet Take 1 tablet (25 mg total) by mouth every 6 (six) hours as needed for nausea or vomiting. 30 tablet 0 Taking    Review of Systems  Constitutional: Negative.   Eyes: Negative for photophobia and visual disturbance.  Cardiovascular: Positive for leg swelling. Negative for chest pain.  Gastrointestinal: Negative.   Genitourinary: Negative.   Neurological: Positive for headaches.   Physical Exam   Blood pressure (!) 159/101, pulse 87, temperature 98.7 F (37.1 C), resp. rate 16, height _0  (1.651 m), weight 244 lb (110.7 kg), last menstrual period 01/23/2017. Patient Vitals for the past 24 hrs:  BP Temp Pulse Resp Height Weight  09/21/17 1546 (!) 148/98 - 80 - - -  09/21/17 1531 (!) 145/93 - 86 - - -  09/21/17 1516 (!) 153/96 - 78 - - -  09/21/17 1503 (!) 160/97 - 79 - - -  09/21/17 1501 (!) 161/108 - 83 - - -  09/21/17 1451 (!) 157/99 - 84 - - -  09/21/17 1438 (!) 159/101 - 87 - - -  09/21/17 1417 (!) 171/97 98.7 F (37.1 C) 86 16 _0  (1.651 m) 244 lb (110.7 kg)    Physical Exam  Nursing note and vitals reviewed. Constitutional: She is oriented to person, place, and time. She appears well-developed and well-nourished. No distress.  HENT:  Head: Normocephalic and atraumatic.  Eyes: Conjunctivae are normal. Right eye exhibits no discharge. Left eye exhibits no discharge. No scleral icterus.  Neck: Normal range of motion.  Cardiovascular: Normal rate, regular rhythm and normal heart sounds.  No murmur heard. Respiratory: Effort normal and breath sounds normal.  No respiratory distress. She has no wheezes.  GI: Soft. Bowel sounds are normal. There is no tenderness.  Musculoskeletal: She exhibits edema (2+ pitting edema BLE).  Neurological: She is alert and oriented to person, place, and time. She has normal reflexes.  No clonus  Skin: Skin is warm and dry. She is not diaphoretic.  Psychiatric: She has a normal mood and affect. Her behavior is normal. Judgment and thought content normal.   NST:  Baseline: 145 bpm, Variability: Good {> 6 bpm), Accelerations: Reactive and Decelerations: Absent   MAU Course  Procedures LabResultsLast24Hours   Results for orders placed or performed during the hospital encounter of 09/21/17 (from the past 24 hour(s))  Comprehensive metabolic panel     Status: Abnormal   Collection Time: 09/21/17  2:32 PM  Result Value Ref Range   Sodium 134 (L) 135 - 145 mmol/L   Potassium 4.1 3.5 - 5.1 mmol/L   Chloride 104 98 - 111 mmol/L   CO2 21 (L) 22 - 32 mmol/L   Glucose, Bld 76 70 - 99 mg/dL   BUN 11 6 - 20 mg/dL   Creatinine, Ser 0.74 0.44 - 1.00 mg/dL   Calcium 9.1 8.9 - 10.3 mg/dL   Total Protein 6.3 (L) 6.5 - 8.1 g/dL   Albumin 3.0 (L) 3.5 - 5.0 g/dL   AST 23 15 - 41 U/L   ALT 17 0 - 44 U/L   Alkaline Phosphatase 103 38 - 126 U/L   Total Bilirubin 0.4 0.3 - 1.2 mg/dL   GFR calc non Af Amer >60 >60 mL/min   GFR calc Af Amer >60 >60 mL/min   Anion gap 9 5 - 15  CBC     Status: Abnormal   Collection Time: 09/21/17  2:32 PM  Result Value Ref Range   WBC 9.0 4.0 - 10.5 K/uL   RBC 4.07 3.87 - 5.11 MIL/uL   Hemoglobin 11.3 (L) 12.0 - 15.0 g/dL   HCT 34.5 (L) 36.0 - 46.0 %   MCV 84.8 78.0 - 100.0 fL   MCH 27.8 26.0 - 34.0 pg   MCHC 32.8 30.0 - 36.0 g/dL   RDW 13.7 11.5 - 15.5 %   Platelets 209 150 - 400 K/uL  Protein / creatinine ratio, urine     Status: Abnormal   Collection Time: 09/21/17  2:34 PM  Result Value Ref Range   Creatinine, Urine 127.00 mg/dL   Total Protein, Urine 48 mg/dL   Protein  Creatinine Ratio 0.38 (H) 0.00 - 0.15 mg/mg[Cre]      Assessment and Plan  A:  1. Pre-eclampsia in third trimester   2. [redacted] weeks gestation of pregnancy   3. Diet controlled gestational diabetes mellitus (GDM) in third trimester    P: Admit to HROB unit Monitor BPs BMZ   Jorje Guild 09/21/2017, 2:44 PM

## 2017-09-21 NOTE — MAU Provider Note (Signed)
History     CSN: [id number]  Arrival date and time: 09/21/17 1401   First Provider Initiated Contact with Patient 09/21/17 1444      Chief Complaint  Patient presents with  . Hypertension   HPI  Olivia Park is a 32 y.o. G2P1001 at 28w5dwho presents with headache and hypertension. Diagnosed with gestational hypertension on Monday. Returned to clinic today for BP check & was sent up here for PEC evaluation. Denies history of hypertension. Reports recurrent headaches since Monday that she has been taking tylenol for. Has not treated headache today. Frontal headache that feels like crushing/throbbing. Nothing makes pain better or worse. No photophobia. Denies visual disturbance, chest pain, n/v, or epigastric pain.   OB History    Gravida  2   Para  1   Term  1   Preterm      AB      Living  1     SAB      TAB      Ectopic      Multiple      Live Births              Past Medical History:  Diagnosis Date  . Gestational diabetes   . Pregnancy induced hypertension     Past Surgical History:  Procedure Laterality Date  . NO PAST SURGERIES      History reviewed. No pertinent family history.  Social History   Tobacco Use  . Smoking status: Never Smoker  . Smokeless tobacco: Never Used  Substance Use Topics  . Alcohol use: No    Comment: socially  . Drug use: No    Allergies: No Known Allergies  Medications Prior to Admission  Medication Sig Dispense Refill Last Dose  . ACCU-CHEK FASTCLIX LANCETS MISC 1 Units by Percutaneous route 4 (four) times daily. 100 each 12 Taking  . Blood Glucose Monitoring Suppl (ACCU-CHEK GUIDE) w/Device KIT 1 kit by Subdermal route QID. Check blood sugars for fasting, and two hours after breakfast, lunch and dinner (4 checks daily) 1 kit 0 Taking  . glucose blood (ACCU-CHEK GUIDE) test strip Use as instructed 100 each 12 Taking  . Prenatal MV-Min-FA-Omega-3 (PRENATAL GUMMIES/DHA & FA PO) Take 2 tablets by mouth  daily.   Taking  . promethazine (PHENERGAN) 25 MG tablet Take 1 tablet (25 mg total) by mouth every 6 (six) hours as needed for nausea or vomiting. 30 tablet 0 Taking    Review of Systems  Constitutional: Negative.   Eyes: Negative for photophobia and visual disturbance.  Cardiovascular: Positive for leg swelling. Negative for chest pain.  Gastrointestinal: Negative.   Genitourinary: Negative.   Neurological: Positive for headaches.   Physical Exam   Blood pressure (!) 159/101, pulse 87, temperature 98.7 F (37.1 C), resp. rate 16, height _0  (1.651 m), weight 244 lb (110.7 kg), last menstrual period 01/23/2017. Patient Vitals for the past 24 hrs:  BP Temp Pulse Resp Height Weight  09/21/17 1546 (!) 148/98 - 80 - - -  09/21/17 1531 (!) 145/93 - 86 - - -  09/21/17 1516 (!) 153/96 - 78 - - -  09/21/17 1503 (!) 160/97 - 79 - - -  09/21/17 1501 (!) 161/108 - 83 - - -  09/21/17 1451 (!) 157/99 - 84 - - -  09/21/17 1438 (!) 159/101 - 87 - - -  09/21/17 1417 (!) 171/97 98.7 F (37.1 C) 86 16 _1  (1.651 m) 244 lb (110.7 kg)  Physical Exam  Nursing note and vitals reviewed. Constitutional: She is oriented to person, place, and time. She appears well-developed and well-nourished. No distress.  HENT:  Head: Normocephalic and atraumatic.  Eyes: Conjunctivae are normal. Right eye exhibits no discharge. Left eye exhibits no discharge. No scleral icterus.  Neck: Normal range of motion.  Cardiovascular: Normal rate, regular rhythm and normal heart sounds.  No murmur heard. Respiratory: Effort normal and breath sounds normal. No respiratory distress. She has no wheezes.  GI: Soft. Bowel sounds are normal. There is no tenderness.  Musculoskeletal: She exhibits edema (2+ pitting edema BLE).  Neurological: She is alert and oriented to person, place, and time. She has normal reflexes.  No clonus  Skin: Skin is warm and dry. She is not diaphoretic.  Psychiatric: She has a normal mood and  affect. Her behavior is normal. Judgment and thought content normal.    MAU Course  Procedures Results for orders placed or performed during the hospital encounter of 09/21/17 (from the past 24 hour(s))  Comprehensive metabolic panel     Status: Abnormal   Collection Time: 09/21/17  2:32 PM  Result Value Ref Range   Sodium 134 (L) 135 - 145 mmol/L   Potassium 4.1 3.5 - 5.1 mmol/L   Chloride 104 98 - 111 mmol/L   CO2 21 (L) 22 - 32 mmol/L   Glucose, Bld 76 70 - 99 mg/dL   BUN 11 6 - 20 mg/dL   Creatinine, Ser 0.74 0.44 - 1.00 mg/dL   Calcium 9.1 8.9 - 10.3 mg/dL   Total Protein 6.3 (L) 6.5 - 8.1 g/dL   Albumin 3.0 (L) 3.5 - 5.0 g/dL   AST 23 15 - 41 U/L   ALT 17 0 - 44 U/L   Alkaline Phosphatase 103 38 - 126 U/L   Total Bilirubin 0.4 0.3 - 1.2 mg/dL   GFR calc non Af Amer >60 >60 mL/min   GFR calc Af Amer >60 >60 mL/min   Anion gap 9 5 - 15  CBC     Status: Abnormal   Collection Time: 09/21/17  2:32 PM  Result Value Ref Range   WBC 9.0 4.0 - 10.5 K/uL   RBC 4.07 3.87 - 5.11 MIL/uL   Hemoglobin 11.3 (L) 12.0 - 15.0 g/dL   HCT 34.5 (L) 36.0 - 46.0 %   MCV 84.8 78.0 - 100.0 fL   MCH 27.8 26.0 - 34.0 pg   MCHC 32.8 30.0 - 36.0 g/dL   RDW 13.7 11.5 - 15.5 %   Platelets 209 150 - 400 K/uL  Protein / creatinine ratio, urine     Status: Abnormal   Collection Time: 09/21/17  2:34 PM  Result Value Ref Range   Creatinine, Urine 127.00 mg/dL   Total Protein, Urine 48 mg/dL   Protein Creatinine Ratio 0.38 (H) 0.00 - 0.15 mg/mg[Cre]    MDM NST:  Baseline: 145 bpm, Variability: Good {> 6 bpm), Accelerations: Reactive and Decelerations: Absent   Cycle Bps. IV antihypertensive protocol ordered for severe range BPs. PEC labs pending. Tylenol 1 gm po for headache.  1 dose of IV labetalol (28m) given with good response, still elevated but no further severe range Headache improved with tylenol Elevated PCR today, up from Monday's urine  C/w Dr. CElly Modena Will obs patient, monitor  BPs, and given antenatal steroids.  Assessment and Plan  A:  1. Pre-eclampsia in third trimester   2. [redacted] weeks gestation of pregnancy   3. Diet controlled gestational  diabetes mellitus (GDM) in third trimester    P: Admit to HROB unit Monitor BPs BMZ   Jorje Guild 09/21/2017, 2:44 PM

## 2017-09-22 ENCOUNTER — Encounter (INDEPENDENT_AMBULATORY_CARE_PROVIDER_SITE_OTHER): Payer: Self-pay

## 2017-09-22 ENCOUNTER — Encounter (HOSPITAL_COMMUNITY): Payer: Self-pay | Admitting: *Deleted

## 2017-09-22 DIAGNOSIS — Z3A35 35 weeks gestation of pregnancy: Secondary | ICD-10-CM

## 2017-09-22 DIAGNOSIS — O24420 Gestational diabetes mellitus in childbirth, diet controlled: Secondary | ICD-10-CM

## 2017-09-22 DIAGNOSIS — O1494 Unspecified pre-eclampsia, complicating childbirth: Secondary | ICD-10-CM

## 2017-09-22 LAB — CBC
HCT: 32.6 % — ABNORMAL LOW (ref 36.0–46.0)
HEMOGLOBIN: 10.9 g/dL — AB (ref 12.0–15.0)
MCH: 28.1 pg (ref 26.0–34.0)
MCHC: 33.4 g/dL (ref 30.0–36.0)
MCV: 84 fL (ref 78.0–100.0)
Platelets: 230 10*3/uL (ref 150–400)
RBC: 3.88 MIL/uL (ref 3.87–5.11)
RDW: 13.8 % (ref 11.5–15.5)
WBC: 13.6 10*3/uL — AB (ref 4.0–10.5)

## 2017-09-22 LAB — GLUCOSE, CAPILLARY: GLUCOSE-CAPILLARY: 79 mg/dL (ref 70–99)

## 2017-09-22 LAB — RPR: RPR: NONREACTIVE

## 2017-09-22 MED ORDER — SENNOSIDES-DOCUSATE SODIUM 8.6-50 MG PO TABS
2.0000 | ORAL_TABLET | ORAL | Status: DC
  Administered 2017-09-22 – 2017-09-23 (×2): 2 via ORAL
  Filled 2017-09-22 (×2): qty 2

## 2017-09-22 MED ORDER — SODIUM CHLORIDE 0.9 % IV SOLN
5.0000 10*6.[IU] | Freq: Once | INTRAVENOUS | Status: DC
  Filled 2017-09-22: qty 5

## 2017-09-22 MED ORDER — MAGNESIUM SULFATE 40 G IN LACTATED RINGERS - SIMPLE
2.0000 g/h | INTRAVENOUS | Status: AC
  Administered 2017-09-22 – 2017-09-23 (×2): 2 g/h via INTRAVENOUS
  Filled 2017-09-22 (×2): qty 40

## 2017-09-22 MED ORDER — ONDANSETRON HCL 4 MG/2ML IJ SOLN: 4.0000 mg | INTRAMUSCULAR | Status: DC | PRN

## 2017-09-22 MED ORDER — ZOLPIDEM TARTRATE 5 MG PO TABS: 5.0000 mg | ORAL_TABLET | Freq: Every evening | ORAL | Status: DC | PRN

## 2017-09-22 MED ORDER — ONDANSETRON HCL 4 MG/2ML IJ SOLN: 4.0000 mg | Freq: Four times a day (QID) | INTRAMUSCULAR | Status: DC | PRN

## 2017-09-22 MED ORDER — ACETAMINOPHEN 325 MG PO TABS: 650.0000 mg | ORAL_TABLET | ORAL | Status: DC | PRN

## 2017-09-22 MED ORDER — LIDOCAINE HCL (PF) 1 % IJ SOLN
30.0000 mL | INTRAMUSCULAR | Status: DC | PRN
  Filled 2017-09-22: qty 30

## 2017-09-22 MED ORDER — OXYCODONE-ACETAMINOPHEN 5-325 MG PO TABS: 1.0000 | ORAL_TABLET | ORAL | Status: DC | PRN

## 2017-09-22 MED ORDER — OXYCODONE-ACETAMINOPHEN 5-325 MG PO TABS: 2.0000 | ORAL_TABLET | ORAL | Status: DC | PRN

## 2017-09-22 MED ORDER — SOD CITRATE-CITRIC ACID 500-334 MG/5ML PO SOLN: 30.0000 mL | ORAL | Status: DC | PRN

## 2017-09-22 MED ORDER — MAGNESIUM SULFATE 40 G IN LACTATED RINGERS - SIMPLE: 2.0000 g/h | INTRAVENOUS | Status: DC

## 2017-09-22 MED ORDER — WITCH HAZEL-GLYCERIN EX PADS: 1.0000 "application " | MEDICATED_PAD | CUTANEOUS | Status: DC | PRN

## 2017-09-22 MED ORDER — PRENATAL MULTIVITAMIN CH
1.0000 | ORAL_TABLET | Freq: Every day | ORAL | Status: DC
  Administered 2017-09-22 – 2017-09-24 (×3): 1 via ORAL
  Filled 2017-09-22 (×3): qty 1

## 2017-09-22 MED ORDER — COCONUT OIL OIL
1.0000 "application " | TOPICAL_OIL | Status: DC | PRN
  Administered 2017-09-22: 1 via TOPICAL
  Filled 2017-09-22: qty 120

## 2017-09-22 MED ORDER — LACTATED RINGERS IV SOLN: 500.0000 mL | Freq: Once | INTRAVENOUS | Status: DC

## 2017-09-22 MED ORDER — BENZOCAINE-MENTHOL 20-0.5 % EX AERO: 1.0000 "application " | INHALATION_SPRAY | CUTANEOUS | Status: DC | PRN

## 2017-09-22 MED ORDER — SIMETHICONE 80 MG PO CHEW: 80.0000 mg | CHEWABLE_TABLET | ORAL | Status: DC | PRN

## 2017-09-22 MED ORDER — ONDANSETRON HCL 4 MG PO TABS
4.0000 mg | ORAL_TABLET | ORAL | Status: DC | PRN
Start: 2017-09-22 — End: 2017-09-24

## 2017-09-22 MED ORDER — PHENYLEPHRINE 40 MCG/ML (10ML) SYRINGE FOR IV PUSH (FOR BLOOD PRESSURE SUPPORT)
PREFILLED_SYRINGE | INTRAVENOUS | Status: AC
  Filled 2017-09-22: qty 20

## 2017-09-22 MED ORDER — FENTANYL 2.5 MCG/ML BUPIVACAINE 1/10 % EPIDURAL INFUSION (WH - ANES): 14.0000 mL/h | INTRAMUSCULAR | Status: DC | PRN

## 2017-09-22 MED ORDER — MAGNESIUM SULFATE BOLUS VIA INFUSION
4.0000 g | Freq: Once | INTRAVENOUS | Status: AC
  Administered 2017-09-22: 4 g via INTRAVENOUS
  Filled 2017-09-22: qty 500

## 2017-09-22 MED ORDER — OXYTOCIN BOLUS FROM INFUSION
500.0000 mL | Freq: Once | INTRAVENOUS | Status: AC
  Administered 2017-09-22: 500 mL via INTRAVENOUS

## 2017-09-22 MED ORDER — EPHEDRINE 5 MG/ML INJ
10.0000 mg | INTRAVENOUS | Status: DC | PRN
  Filled 2017-09-22: qty 2

## 2017-09-22 MED ORDER — DIBUCAINE 1 % RE OINT: 1.0000 "application " | TOPICAL_OINTMENT | RECTAL | Status: DC | PRN

## 2017-09-22 MED ORDER — DIPHENHYDRAMINE HCL 50 MG/ML IJ SOLN: 12.5000 mg | INTRAMUSCULAR | Status: DC | PRN

## 2017-09-22 MED ORDER — PHENYLEPHRINE 40 MCG/ML (10ML) SYRINGE FOR IV PUSH (FOR BLOOD PRESSURE SUPPORT)
80.0000 ug | PREFILLED_SYRINGE | INTRAVENOUS | Status: DC | PRN
  Filled 2017-09-22: qty 5

## 2017-09-22 MED ORDER — HYDRALAZINE HCL 20 MG/ML IJ SOLN
10.0000 mg | Freq: Once | INTRAMUSCULAR | Status: DC | PRN
Start: 2017-09-22 — End: 2017-09-24

## 2017-09-22 MED ORDER — DIPHENHYDRAMINE HCL 25 MG PO CAPS: 25.0000 mg | ORAL_CAPSULE | Freq: Four times a day (QID) | ORAL | Status: DC | PRN

## 2017-09-22 MED ORDER — IBUPROFEN 600 MG PO TABS
600.0000 mg | ORAL_TABLET | Freq: Four times a day (QID) | ORAL | Status: DC
  Administered 2017-09-22 – 2017-09-24 (×11): 600 mg via ORAL
  Filled 2017-09-22 (×11): qty 1

## 2017-09-22 MED ORDER — LACTATED RINGERS IV SOLN: INTRAVENOUS | Status: DC

## 2017-09-22 MED ORDER — LACTATED RINGERS IV SOLN: 500.0000 mL | INTRAVENOUS | Status: DC | PRN

## 2017-09-22 MED ORDER — TETANUS-DIPHTH-ACELL PERTUSSIS 5-2.5-18.5 LF-MCG/0.5 IM SUSP: 0.5000 mL | Freq: Once | INTRAMUSCULAR | Status: DC

## 2017-09-22 MED ORDER — OXYTOCIN 40 UNITS IN LACTATED RINGERS INFUSION - SIMPLE MED
2.5000 [IU]/h | INTRAVENOUS | Status: DC
  Filled 2017-09-22: qty 1000

## 2017-09-22 MED ORDER — PENICILLIN G POT IN DEXTROSE 60000 UNIT/ML IV SOLN
3.0000 10*6.[IU] | INTRAVENOUS | Status: DC
  Filled 2017-09-22: qty 50

## 2017-09-22 MED ORDER — LACTATED RINGERS IV SOLN
INTRAVENOUS | Status: DC
Start: 2017-09-22 — End: 2017-09-24
  Administered 2017-09-22: 17:00:00 via INTRAVENOUS

## 2017-09-22 MED ORDER — FENTANYL 2.5 MCG/ML BUPIVACAINE 1/10 % EPIDURAL INFUSION (WH - ANES)
INTRAMUSCULAR | Status: AC
  Filled 2017-09-22: qty 100

## 2017-09-22 MED ORDER — FLEET ENEMA 7-19 GM/118ML RE ENEM: 1.0000 | ENEMA | RECTAL | Status: DC | PRN

## 2017-09-22 MED ORDER — LABETALOL HCL 5 MG/ML IV SOLN
20.0000 mg | INTRAVENOUS | Status: AC | PRN
  Administered 2017-09-22: 80 mg via INTRAVENOUS
  Administered 2017-09-22: 20 mg via INTRAVENOUS
  Administered 2017-09-22: 40 mg via INTRAVENOUS
  Filled 2017-09-22: qty 16
  Filled 2017-09-22: qty 8
  Filled 2017-09-22: qty 4

## 2017-09-22 NOTE — Lactation Note (Signed)
This note was copied from a baby's chart. Lactation Consultation Note  Patient Name: Olivia Park MWUXL'KToday's Date: 09/22/2017 Reason for consult: Initial assessment;Late-preterm 34-36.6wks;Infant < 6lbs;Maternal endocrine disorder Type of Endocrine Disorder?: Diabetes(Gestational)   Initial consult with mom of 3 hour old LPT infant. Infant STS with  Mom. Mom reports he BF well after birth. She recently tried to BF and infant was not interested.   Attempted to awaken infant and get him to latch, he was not interested. We then hand expressed and obtained 3 cc colostrum that was fed to infant via syringe. He then latched for less than a minute and fell asleep. It was difficult to get a deep latch and mom indicated pain with latch that did improve some with feeding, he did not stay latched long.  Infant left STS with mom. Blood glucose was drawn 2-3 minutes infant had colostrum. Mom will not be able to syringe feed infant as she has very long nails.   Mom with good flow of colostrum. She will need review on hand expression as she was holding infant STS and was difficult for her to hand express at this time. Olivia BarbBrianaa, RN is aware.   LPT infant policy Handout reviewed and given to mom. Enc mom to keep infant STS when she is awake, offer the breast at least every 3 hours and follow with supplementation of EBM first and follow with formula as needed. Enc mom to limit stimulation to infant and keep his hat on at all times.  We reviewed the importance of calories for infant due to LPT status and weight. Supplementation amounts given and reviewed with mom. Reviewed with mom the typical feeding behaviors of the LPT infant.   DEBP offered and encouraged. Mom agreeable. DEBP set up with instructions for use on Initiate setting, assembling, disassembling and cleaning of pump parts. Enc mom to pump every 2-3 hours post BF and to follow with hand expression.   [facility] and is interested in checking  into getting a pump. [facility].   BF Resources handout and LC Brochure given, mom informed of IP/OP Services, BF Support Groups and LC phone #.   Written feeding /pumping plan put on white board in the room and reviewed with mom and godmother. Mom reports all questions have been answered. Mom to call out for feeding assistance as needed.   Report and plan of care to Olivia RosserBrianna Williams, RN.      Maternal Data Formula Feeding for Exclusion: No Has patient been taught Hand Expression?: Yes Does the patient have breastfeeding experience prior to this delivery?: Yes  Feeding Feeding Type: Breast Fed Length of feed: 1 min  LATCH Score Latch: Too sleepy or reluctant, no latch achieved, no sucking elicited.  Audible Swallowing: None  Type of Nipple: Everted at rest and after stimulation  Comfort (Breast/Nipple): Soft / non-tender  Hold (Positioning): Assistance needed to correctly position infant at breast and maintain latch.  LATCH Score: 5  Interventions Interventions: Breast feeding basics reviewed;Support pillows;Assisted with latch;Skin to skin;Breast compression;Breast massage;DEBP;Hand express  Lactation Tools Discussed/Used WIC Program: Yes Pump Review: Setup, frequency, and cleaning;Milk Storage Initiated by:: Olivia StainSharon Kinsey Karch, RN, IBCLC Date initiated:: 09/22/17   Consult Status Consult Status: Follow-up Date: 09/23/17 Follow-up type: In-patient    Olivia Park 09/22/2017, 11:47 AM

## 2017-09-22 NOTE — Progress Notes (Signed)
Progress Note  Bing PlumeLashanda T Gohr is a 32 y.o. G2P1001 at 2746w6d who was admitted to antepartum due to preeclampsia. Patient started to complain of contractions and pressure. Was check by nurse and noted to be be dilated and in early labor; ?SROM.   S: Wanting epidural.   O:  BP (!) 149/99   Pulse 97   Temp 99.4 F (37.4 C) (Oral)   Resp 18   Ht 5\' 5"  (1.651 m)   Wt 110.7 kg (244 lb)   LMP 01/23/2017 (Approximate)   SpO2 100%   BMI 40.60 kg/m   No intake/output data recorded.  FHT:  FHR: 135 bpm, variability: moderate,  accelerations:  Present,  decelerations:  Present variable UC:   irregular, every 10 minutes SVE:   Dilation: 4.5 Effacement (%): 30 Station: -3 Exam by:: Carollee LeitzEmily Cervantes, RN   Labs: Lab Results  Component Value Date   WBC 13.6 (H) 09/22/2017   HGB 10.9 (L) 09/22/2017   HCT 32.6 (L) 09/22/2017   MCV 84.0 09/22/2017   PLT 230 09/22/2017    Assessment / Plan: 32 y.o. G2P1001 6846w6d  in early labor Spontaneous labor Preeclampsia - mild GDMA1  Labor: Monitor. S/p BMZ x2. ?SROM per RN.   Preeclampsia: Monitor BPs. Antihypertensive protocol as needed. No Magnesium at this time per Dr. Despina HiddenEure.  GDM: diet controlled, CBG on admission 79 Fetal Wellbeing:  Category II Pain Control:  plan for epidural Anticipated MOD:  NSVD  Expectant management   Caryl AdaJazma Elyan Vanwieren, DO OB Fellow [facility], [facility]

## 2017-09-22 NOTE — Progress Notes (Signed)
Called to pt. Room (2:45A). Per pt. Post voiding, she had a trickle of fluid that went down her leg. Not sure if it was urine or vaginal. Pt. Back in bed at this time and checked. Dry appearing. Towel placed between pt's legs (not wearing mesh panties, did not think a pad would stay in place) No fluid seen on towel at this time after placement. Pt. Reported the same trickle post void but again no visible fluid on gown or in bed or that appears to be trickling from her vagina.  Pt. Complaint of contractions Q10 mins that are 10/10 and lasting for 1-1.5 mins. Pt. Placed on monitor. FP MD called.  Will monitor.

## 2017-09-22 NOTE — Consult Note (Signed)
Neonatology Note:   Attendance at Delivery:    I was asked by Dr. Phelps to attend this vaginal delviery at 35 6/[redacted] weeks GA.. The mother is a G2P1, GBS neg with good prenatal care. Complicated by pre-eclampsia and GDM.  ROM 1 hours before delivery, fluid clear. Infant vigorous with good spontaneous cry and tone. +60 sec DCC.  Needed only minimal bulb suctioning. Ap 8/9. Lungs clear to ausc in DR. To CN to care of Pediatrician.  Zena Vitelli C. Jaimere Feutz, MD 

## 2017-09-23 MED ORDER — AMLODIPINE BESYLATE 5 MG PO TABS
5.0000 mg | ORAL_TABLET | Freq: Every day | ORAL | Status: DC
  Administered 2017-09-23 – 2017-09-24 (×2): 5 mg via ORAL
  Filled 2017-09-23 (×2): qty 1

## 2017-09-23 NOTE — Lactation Note (Addendum)
This note was copied from a baby's chart. Lactation Consultation Note  Patient Name: Boy Floyce StakesLashanda Luevano ZOXWR'UToday's Date: 09/23/2017 Reason for consult: Follow-up assessment;Late-preterm 34-36.6wks   [facility] and requested a DEBP for mother.  WIC referral was faxed yesterday.   Maternal Data    Feeding Feeding Type: Breast Fed  LATCH Score Latch: Grasps breast easily, tongue down, lips flanged, rhythmical sucking.  Audible Swallowing: A few with stimulation  Type of Nipple: Everted at rest and after stimulation  Comfort (Breast/Nipple): Filling, red/small blisters or bruises, mild/mod discomfort  Hold (Positioning): Assistance needed to correctly position infant at breast and maintain latch.  LATCH Score: 7  Interventions Interventions: Assisted with latch;Breast feeding basics reviewed;Skin to skin;Breast compression;Adjust position;Support pillows;Coconut oil  Lactation Tools Discussed/Used WIC Program: Yes   Consult Status Consult Status: Follow-up Date: 09/24/17 Follow-up type: In-patient    Dahlia ByesBerkelhammer, Ruth [facility] 09/23/2017, 2:06 PM

## 2017-09-23 NOTE — Lactation Note (Signed)
This note was copied from a baby's chart. Lactation Consultation Note  Patient Name: Olivia Park WUJWJ'XToday's Date: 09/23/2017 Reason for consult: Follow-up assessment;Late-preterm 34-36.6wks Mom and infant sts on arrival.  Assisted with undressing and rousing infant to breastfeed.  Lc assisted mom with positioning and obtaining deeper latch.  Observed the latch in cross cradle/pillows for support/intermittent sucks and swallows.  Discussed late preterm infant.  Urged mom to pump past breastfeeds and feed back all emm and then follow up with formula as needed to get the recommended feeding amount for supplement for late preterm.  Left mom and baby breastfeeding.  Will follow up 09/24/17 unless mom requests earlier follow up.    Feeding Feeding Type: Breast Fed  LATCH Score Latch: Grasps breast easily, tongue down, lips flanged, rhythmical sucking.  Audible Swallowing: A few with stimulation  Type of Nipple: Everted at rest and after stimulation  Comfort (Breast/Nipple): Filling, red/small blisters or bruises, mild/mod discomfort  Hold (Positioning): Assistance needed to correctly position infant at breast and maintain latch.  LATCH Score: 7  Interventions Interventions: Assisted with latch;Breast feeding basics reviewed;Skin to skin;Breast compression;Adjust position;Support pillows;Coconut oil  Lactation Tools Discussed/Used WIC Program: Yes   Consult Status Consult Status: Follow-up Date: 09/24/17 Follow-up type: In-patient    Olivia Park 09/23/2017, 1:03 PM

## 2017-09-23 NOTE — Progress Notes (Signed)
Faculty Attending Note  Post Partum Day 1  Subjective: Patient is feeling well. She reports well controlled pain on PO pain meds. She is ambulating and denies light-headedness or dizziness. She is passing flatus. She is tolerating a regular diet without nausea/vomiting. Bleeding is moderate. She is breast feeding. Baby is in nursery and doing well.  Objective: Blood pressure (!) 139/97, pulse 89, temperature 98.3 F (36.8 C), temperature source Oral, resp. rate 18, height 5\' 5"  (1.651 m), weight 244 lb (110.7 kg), last menstrual period 01/23/2017, SpO2 100 %, unknown if currently breastfeeding. Temp:  [97.9 F (36.6 C)-99.4 F (37.4 C)] 98.3 F (36.8 C) (06/28 0418) Pulse Rate:  [82-108] 89 (06/28 0418) Resp:  [17-18] 18 (06/28 0418) BP: (124-180)/(70-128) 139/97 (06/28 0418) SpO2:  [98 %-100 %] 100 % (06/28 0418)  Physical Exam:  General: alert, oriented, cooperative Chest: CTAB, normal respiratory effort Heart: RRR  Abdomen: +BS, soft, appropriately tender to palpation  Uterine Fundus: firm, 2 fingers below the umbilicus Lochia: moderate, rubra DVT Evaluation: no evidence of DVT Extremities: no edema, no calf tenderness   Current Facility-Administered Medications:  .  acetaminophen (TYLENOL) tablet 650 mg, 650 mg, Oral, Q4H PRN, Phelps, Jazma Y, DO .  amLODipine (NORVASC) tablet 5 mg, 5 mg, Oral, Daily, Conan Bowensavis, Chalmer Zheng M, MD .  benzocaine-Menthol (DERMOPLAST) 20-0.5 % topical spray 1 application, 1 application, Topical, PRN, Doroteo GlassmanPhelps, Jazma Y, DO .  coconut oil, 1 application, Topical, PRN, Pincus Largehelps, Jazma Y, DO, 1 application at 09/22/17 1758 .  witch hazel-glycerin (TUCKS) pad 1 application, 1 application, Topical, PRN **AND** dibucaine (NUPERCAINAL) 1 % rectal ointment 1 application, 1 application, Rectal, PRN, Doroteo GlassmanPhelps, Jazma Y, DO .  diphenhydrAMINE (BENADRYL) capsule 25 mg, 25 mg, Oral, Q6H PRN, Doroteo GlassmanPhelps, Jazma Y, DO .  hydrALAZINE (APRESOLINE) injection 10 mg, 10 mg, Intravenous,  Once PRN, Doroteo GlassmanPhelps, Jazma Y, DO .  ibuprofen (ADVIL,MOTRIN) tablet 600 mg, 600 mg, Oral, Q6H, Phelps, Jazma Y, DO, 600 mg at 09/23/17 0545 .  lactated ringers infusion, , Intravenous, Continuous, Constant, Peggy, MD, Last Rate: 50 mL/hr at 09/22/17 1635 .  magnesium sulfate 40 grams in LR 500 mL OB infusion, 2 g/hr, Intravenous, Continuous, Garnette Gunnerhompson, Aaron B, MD, Last Rate: 25 mL/hr at 09/23/17 0500, 2 g/hr at 09/23/17 0500 .  ondansetron (ZOFRAN) tablet 4 mg, 4 mg, Oral, Q4H PRN **OR** ondansetron (ZOFRAN) injection 4 mg, 4 mg, Intravenous, Q4H PRN, Doroteo GlassmanPhelps, Jazma Y, DO .  prenatal multivitamin tablet 1 tablet, 1 tablet, Oral, Q1200, Pincus Largehelps, Jazma Y, DO, 1 tablet at 09/22/17 1154 .  senna-docusate (Senokot-S) tablet 2 tablet, 2 tablet, Oral, Q24H, Pincus Largehelps, Jazma Y, DO, 2 tablet at 09/22/17 2341 .  simethicone (MYLICON) chewable tablet 80 mg, 80 mg, Oral, PRN, Pincus LargePhelps, Jazma Y, DO .  Tdap (BOOSTRIX) injection 0.5 mL, 0.5 mL, Intramuscular, Once, Phelps, Jazma Y, DO .  zolpidem (AMBIEN) tablet 5 mg, 5 mg, Oral, QHS PRN, Pincus Largehelps, Jazma Y, DO Recent Labs    09/21/17 1640 09/22/17 0628  HGB 11.1* 10.9*  HCT 34.2* 32.6*    Assessment/Plan:  Patient is 32 y.o. [id number] PPD#1 s/p SVD at 7774w6d after admission in active labor, also with gDMA1 and pre-eclampsia with severe features. She is doing very well, has no complaints this am, recovering appropriately. MgSO4 due to come off this am. Continues to have elevated BP on MgSO4, will start norvasc 5 mg PO daily today.   norvasc 5 mg daily MgSO4 2 g/hr until 8 am Continue routine post partum care  Pain meds prn Regular diet Depo for birth control Plan for discharge possibly tomorrow   K. Therese Sarah, M.D. Attending Obstetrician & Gynecologist, [facility] for [employer], [facility]  09/23/2017, 6:24 AM

## 2017-09-24 MED ORDER — IBUPROFEN 600 MG PO TABS: 600.0000 mg | ORAL_TABLET | Freq: Four times a day (QID) | ORAL | 0 refills | Status: DC

## 2017-09-24 MED ORDER — AMLODIPINE BESYLATE 10 MG PO TABS: 10.0000 mg | ORAL_TABLET | Freq: Every day | ORAL | 0 refills | Status: DC

## 2017-09-24 NOTE — Discharge Summary (Signed)
OB Discharge Summary     Patient Name: Olivia Park DOB: 1986-02-06 MRN: [id number]  Date of admission: 09/21/2017 Delivering MD: Luiz Blare Y   Date of discharge: 09/24/2017  Admitting diagnosis: 31WKS HTN Intrauterine pregnancy: [redacted]w[redacted]d    Secondary diagnosis:  Active Problems:   Preeclampsia, third trimester   SVD (spontaneous vaginal delivery)     Discharge diagnosis: Preterm Pregnancy Delivered and Preeclampsia (severe)                                  Hospital course:  Induction of Labor With Vaginal Delivery   32y.o. yo [id number] 31w6das admitted to the hospital 09/21/2017 for induction of labor.  Indication for induction: Preeclampsia.  Patient had an uncomplicated labor course as follows: Membrane Rupture Time/Date: 2:00 AM ,09/22/2017   Intrapartum Procedures: Episiotomy: None [1]                                         Lacerations:  None [1]  Patient had delivery of a Viable infant.  Information for the patient's newborn:  HaTiara, M[place]0[[id number]]Delivery Method: Vag-Spont   09/22/2017  Details of delivery can be found in separate delivery note.  Patient had a routine postpartum course. She received magnesium sulfate for 24 hour following delivery. She was started on Norvasc with plans to follow up in the office next week for BP check. Patient is discharged home 09/24/17.  Physical exam  Vitals:   09/23/17 2356 09/24/17 0321 09/24/17 0500 09/24/17 0748  BP: (!) 153/96 128/81 (!) (P) 140/91 (!) 155/103  Pulse: 84 83  79  Resp: _0 Temp: 97.7 F (36.5 C) 98.7 F (37.1 C)  98.4 F (36.9 C)  TempSrc: Oral Oral  Oral  SpO2: 100% 100%  100%  Weight:      Height:       General: alert, cooperative and no distress Lochia: appropriate Uterine Fundus: firm DVT Evaluation: No evidence of DVT seen on physical exam. Labs: Lab Results  Component Value Date   WBC 13.6 (H) 09/22/2017   HGB 10.9 (L) 09/22/2017   HCT 32.6 (L) 09/22/2017   MCV  84.0 09/22/2017   PLT 230 09/22/2017   CMP Latest Ref Rng & Units 09/21/2017  Glucose 70 - 99 mg/dL -  BUN 6 - 20 mg/dL -  Creatinine 0.44 - 1.00 mg/dL 0.71  Sodium 135 - 145 mmol/L -  Potassium 3.5 - 5.1 mmol/L -  Chloride 98 - 111 mmol/L -  CO2 22 - 32 mmol/L -  Calcium 8.9 - 10.3 mg/dL -  Total Protein 6.5 - 8.1 g/dL -  Total Bilirubin 0.3 - 1.2 mg/dL -  Alkaline Phos 38 - 126 U/L -  AST 15 - 41 U/L -  ALT 0 - 44 U/L -    Discharge instruction: per After Visit Summary and "Baby and Me Booklet".  After visit meds:  Allergies as of 09/24/2017   No Known Allergies     Medication List    STOP taking these medications   ACCU-CHEK FASTCLIX LANCETS Misc   ACCU-CHEK GUIDE w/Device Kit   glucose blood test strip Commonly known as:  ACCU-CHEK GUIDE   promethazine 25 MG tablet Commonly known as:  PHENERGAN     TAKE  these medications   amLODipine 10 MG tablet Commonly known as:  NORVASC Take 1 tablet (10 mg total) by mouth daily.   ibuprofen 600 MG tablet Commonly known as:  ADVIL,MOTRIN Take 1 tablet (600 mg total) by mouth every 6 (six) hours.   PRENATAL GUMMIES/DHA & FA PO Take 2 tablets by mouth daily.       Diet: routine diet  Activity: Advance as tolerated. Pelvic rest for 6 weeks.   Outpatient follow up: Follow up Appt: Future Appointments  Date Time Provider [place]  09/28/2017  1:30 PM Tennyson Rhodes  10/24/2017  3:55 PM Chancy Milroy, MD [facility]   Follow up Visit:No follow-ups on file.  Postpartum contraception: Depo Provera deisred  Newborn Data: Live born female  Birth Weight: 5 lb 12.2 oz (2615 g) APGAR: 8, 9  Newborn Delivery   Birth date/time:  09/22/2017 07:19:00 Delivery type:  Vaginal, Spontaneous     Baby Feeding: Breast Disposition:home with mother   09/24/2017 Mora Bellman, MD

## 2017-09-24 NOTE — Discharge Instructions (Signed)
Postpartum Care After Vaginal Delivery °The period of time right after you deliver your newborn is called the postpartum period. °What kind of medical care will I receive? °· You may continue to receive fluids and medicines through an IV tube inserted into one of your veins. °· If an incision was made near your vagina (episiotomy) or if you had some vaginal tearing during delivery, cold compresses may be placed on your episiotomy or your tear. This helps to reduce pain and swelling. °· You may be given a squirt bottle to use when you go to the bathroom. You may use this until you are comfortable wiping as usual. To use the squirt bottle, follow these steps: °? Before you urinate, fill the squirt bottle with warm water. Do not use hot water. °? After you urinate, while you are sitting on the toilet, use the squirt bottle to rinse the area around your urethra and vaginal opening. This rinses away any urine and blood. °? You may do this instead of wiping. As you start healing, you may use the squirt bottle before wiping yourself. Make sure to wipe gently. °? Fill the squirt bottle with clean water every time you use the bathroom. °· You will be given sanitary pads to wear. °How can I expect to feel? °· You may not feel the need to urinate for several hours after delivery. °· You will have some soreness and pain in your abdomen and vagina. °· If you are breastfeeding, you may have uterine contractions every time you breastfeed for up to several weeks postpartum. Uterine contractions help your uterus return to its normal size. °· It is normal to have vaginal bleeding (lochia) after delivery. The amount and appearance of lochia is often similar to a menstrual period in the first week after delivery. It will gradually decrease over the next few weeks to a dry, yellow-brown discharge. For most women, lochia stops completely by 6-8 weeks after delivery. Vaginal bleeding can vary from woman to woman. °· Within the first few  days after delivery, you may have breast engorgement. This is when your breasts feel heavy, full, and uncomfortable. Your breasts may also throb and feel hard, tightly stretched, warm, and tender. After this occurs, you may have milk leaking from your breasts. Your health care provider can help you relieve discomfort due to breast engorgement. Breast engorgement should go away within a few days. °· You may feel more sad or worried than normal due to hormonal changes after delivery. These feelings should not last more than a few days. If these feelings do not go away after several days, speak with your health care provider. °How should I care for myself? °· Tell your health care provider if you have pain or discomfort. °· Drink enough water to keep your urine clear or pale yellow. °· Wash your hands thoroughly with soap and water for at least 20 seconds after changing your sanitary pads, after using the toilet, and before holding or feeding your baby. °· If you are not breastfeeding, avoid touching your breasts a lot. Doing this can make your breasts produce more milk. °· If you become weak or lightheaded, or you feel like you might faint, ask for help before: °? Getting out of bed. °? Showering. °· Change your sanitary pads frequently. Watch for any changes in your flow, such as a sudden increase in volume, a change in color, the passing of large blood clots. If you pass a blood clot from your vagina, save it   to show to your health care provider. Do not flush blood clots down the toilet without having your health care provider look at them. °· Make sure that all your vaccinations are up to date. This can help protect you and your baby from getting certain diseases. You may need to have immunizations done before you leave the hospital. °· If desired, talk with your health care provider about methods of family planning or birth control (contraception). °How can I start bonding with my baby? °Spending as much time as  possible with your baby is very important. During this time, you and your baby can get to know each other and develop a bond. Having your baby stay with you in your room (rooming in) can give you time to get to know your baby. Rooming in can also help you become comfortable caring for your baby. Breastfeeding can also help you bond with your baby. °How can I plan for returning home with my baby? °· Make sure that you have a car seat installed in your vehicle. °? Your car seat should be checked by a certified car seat installer to make sure that it is installed safely. °? Make sure that your baby fits into the car seat safely. °· Ask your health care provider any questions you have about caring for yourself or your baby. Make sure that you are able to contact your health care provider with any questions after leaving the hospital. °This information is not intended to replace advice given to you by your health care provider. Make sure you discuss any questions you have with your health care provider. °Document Released: 01/10/2007 Document Revised: 08/18/2015 Document Reviewed: 02/17/2015 °Elsevier Interactive Patient Education © 2018 [employer] Inc. ° °

## 2017-09-24 NOTE — Progress Notes (Signed)
Discharge instructions and prescriptions given to pt. Discussed post-vaginal care, signs and symptoms to report to the MD, upcoming appointments, and meds. Pt verbalizes understanding and has no questions or concerns at this time.

## 2017-09-25 ENCOUNTER — Ambulatory Visit: Payer: Self-pay

## 2017-09-25 NOTE — Lactation Note (Signed)
This note was copied from a baby's chart. Lactation Consultation Note  Patient Name: Olivia Floyce StakesLashanda Nuzzo ZOXWR'UToday's Date: 09/25/2017 Reason for consult: Follow-up assessment;Late-preterm 34-36.6wks;Infant < 6lbs   Baby 74 hours old. 4351w6d <  6 lbs and at end of feeding when LC entered room. Mother has been breastfeeding and supplementing w/ formula after. Recommend mother post pump 4-6 times per day for 10-20 min with DEBP on initiation setting. Give baby back volume pumped at the next feeding. Reviewed milk storage.  Mom encouraged to feed baby 8-12 times/24 hours and with feeding cues at least q 3 hours.  Reviewed engorgement care and monitoring voids/stools. Praised mother for her efforts.       Maternal Data    Feeding Feeding Type: Breast Fed Nipple Type: Slow - flow Length of feed: 25 min  LATCH Score Latch: Grasps breast easily, tongue down, lips flanged, rhythmical sucking.  Audible Swallowing: A few with stimulation  Type of Nipple: Everted at rest and after stimulation  Comfort (Breast/Nipple): Filling, red/small blisters or bruises, mild/mod discomfort  Hold (Positioning): No assistance needed to correctly position infant at breast.  LATCH Score: 8  Interventions Interventions: Breast feeding basics reviewed;DEBP  Lactation Tools Discussed/Used     Consult Status Consult Status: Complete    Hardie PulleyBerkelhammer, Katielynn Horan Boschen 09/25/2017, 9:49 AM

## 2017-09-28 ENCOUNTER — Encounter: Payer: Medicaid Other | Admitting: Advanced Practice Midwife

## 2017-09-28 ENCOUNTER — Ambulatory Visit (INDEPENDENT_AMBULATORY_CARE_PROVIDER_SITE_OTHER): Payer: Medicaid Other | Admitting: General Practice

## 2017-09-28 VITALS — BP 153/102 | HR 86 | Ht 65.0 in | Wt 222.0 lb

## 2017-09-28 DIAGNOSIS — Z0130 Encounter for examination of blood pressure without abnormal findings: Secondary | ICD-10-CM

## 2017-09-28 NOTE — Progress Notes (Signed)
Patient presents to office today for BP check following SVD on 6/27. Patient reports taking norvasc daily- denies headaches, dizziness or blurry vision. No edema noted.   Reviewed today's elevated pressures with Dr Jolayne Pantheronstant who recommends repeat BP check in 1 week. Discussed with patient. Recommended she return to office sooner or MAU if after hours if she starts having headaches, dizziness, or blurry vision. Patient verbalized understanding & had no questions.

## 2017-09-28 NOTE — Progress Notes (Signed)
I have reviewed the chart and agree with nursing staff's documentation of this patient's encounter.  Armstrong Bingharlie Lilas Diefendorf, MD 09/28/2017 8:07 AM

## 2017-09-29 NOTE — Progress Notes (Signed)
I have reviewed the chart and agree with nursing staff's documentation of this patient's encounter.  Catalina AntiguaPeggy Windle Huebert, MD 09/29/2017 12:29 PM

## 2017-10-05 ENCOUNTER — Ambulatory Visit: Payer: Medicaid Other

## 2017-10-05 ENCOUNTER — Ambulatory Visit (INDEPENDENT_AMBULATORY_CARE_PROVIDER_SITE_OTHER): Payer: Medicaid Other | Admitting: *Deleted

## 2017-10-05 VITALS — BP 138/86 | HR 86 | Wt 214.4 lb

## 2017-10-05 DIAGNOSIS — Z0130 Encounter for examination of blood pressure without abnormal findings: Secondary | ICD-10-CM

## 2017-10-05 NOTE — Progress Notes (Signed)
I have reviewed this chart and agree with the RN/CMA assessment and management.    K. Meryl Davis, M.D. [facility]  

## 2017-10-05 NOTE — Progress Notes (Signed)
Here for bp check today. States had home visit yesterday and bp was like 135/75.  Reviewed bp's from today with Dr. Earlene Plateravis and instructed patient to keep taking norvasc, come to mau for severe headache , edema, etc; also to keep pp appointment as scheduled. She voices understanding.

## 2017-10-24 ENCOUNTER — Ambulatory Visit (INDEPENDENT_AMBULATORY_CARE_PROVIDER_SITE_OTHER): Payer: Medicaid Other | Admitting: Obstetrics and Gynecology

## 2017-10-24 ENCOUNTER — Encounter: Payer: Self-pay | Admitting: Obstetrics and Gynecology

## 2017-10-24 DIAGNOSIS — Z8632 Personal history of gestational diabetes: Secondary | ICD-10-CM | POA: Diagnosis not present

## 2017-10-24 DIAGNOSIS — Z8759 Personal history of other complications of pregnancy, childbirth and the puerperium: Secondary | ICD-10-CM | POA: Diagnosis not present

## 2017-10-24 DIAGNOSIS — Z1389 Encounter for screening for other disorder: Secondary | ICD-10-CM

## 2017-10-24 LAB — POCT PREGNANCY, URINE: Preg Test, Ur: NEGATIVE

## 2017-10-24 NOTE — Progress Notes (Signed)
Subjective:     Olivia Park is a 32 y.o. female who presents for a postpartum visit. She is 4 weeks postpartum following a spontaneous vaginal delivery. I have fully reviewed the prenatal and intrapartum course. The delivery was at 3638w6d gestational weeks. Outcome: spontaneous vaginal delivery. Anesthesia: none. [facility]. BP controlled on Norvasc. . Baby's course has been unremarkable. Baby is feeding by breast. Bleeding staining only. Bowel function is normal. Bladder function is normal. Patient is not sexually active. Contraception method is none. Postpartum depression screening: negative.  The following portions of the patient's history were reviewed and updated as appropriate: allergies, current medications, past family history, past medical history, past social history and past surgical history.  Review of Systems Pertinent items noted in HPI and remainder of comprehensive ROS otherwise negative.   Pap smear 3/19, normal  Objective:    Ht 5\' 5"  (1.651 m)   Wt 212 lb (96.2 kg)   LMP 01/23/2017 (Approximate)   Breastfeeding? Yes   BMI 35.28 kg/m   General:  alert   Breasts:  no examined  Lungs: clear to auscultation bilaterally  Heart:  regular rate and rhythm, S1, S2 normal, no murmur, click, rub or gallop  Abdomen: soft, non-tender; bowel sounds normal; no masses,  no organomegaly   Vulva:  not evaluated  Vagina: not evaluated  Cervix:  not examined  Corpus: not examined  Adnexa:  not evaluated  Rectal Exam: Not performed.        Assessment:     NL postpartum exam.    Contraceptive management   H/O GDM   H/O GHTN  Plan:    1. Contraception: condoms. Information on contraception choices provided to pt.  2. Will complete current Rx of Norvasc and then d/c. To follow up with PCP for BP monitoring of BP meds 3. Fasting lab appt for glucola 4. Follow up in: 1 yr or as needed.

## 2017-10-24 NOTE — Patient Instructions (Signed)

## 2017-12-13 ENCOUNTER — Other Ambulatory Visit (HOSPITAL_COMMUNITY)
Admission: RE | Admit: 2017-12-13 | Discharge: 2017-12-13 | Disposition: A | Discharge status: Home / Self Care | Payer: Medicaid Other | Source: Ambulatory Visit | Attending: Obstetrics & Gynecology | Admitting: Obstetrics & Gynecology

## 2017-12-13 ENCOUNTER — Ambulatory Visit (INDEPENDENT_AMBULATORY_CARE_PROVIDER_SITE_OTHER): Payer: Medicaid Other | Admitting: Obstetrics & Gynecology

## 2017-12-13 ENCOUNTER — Encounter: Payer: Self-pay | Admitting: Obstetrics & Gynecology

## 2017-12-13 VITALS — BP 145/91 | HR 79 | Wt 219.6 lb

## 2017-12-13 DIAGNOSIS — I10 Essential (primary) hypertension: Secondary | ICD-10-CM

## 2017-12-13 DIAGNOSIS — Z0000 Encounter for general adult medical examination without abnormal findings: Secondary | ICD-10-CM | POA: Diagnosis not present

## 2017-12-13 DIAGNOSIS — Z3202 Encounter for pregnancy test, result negative: Secondary | ICD-10-CM | POA: Diagnosis not present

## 2017-12-13 DIAGNOSIS — Z3042 Encounter for surveillance of injectable contraceptive: Secondary | ICD-10-CM

## 2017-12-13 DIAGNOSIS — Z3009 Encounter for other general counseling and advice on contraception: Secondary | ICD-10-CM | POA: Diagnosis not present

## 2017-12-13 DIAGNOSIS — Z23 Encounter for immunization: Secondary | ICD-10-CM | POA: Diagnosis not present

## 2017-12-13 DIAGNOSIS — Z8632 Personal history of gestational diabetes: Secondary | ICD-10-CM

## 2017-12-13 DIAGNOSIS — IMO0001 Reserved for inherently not codable concepts without codable children: Secondary | ICD-10-CM

## 2017-12-13 LAB — POCT PREGNANCY, URINE: Preg Test, Ur: NEGATIVE

## 2017-12-13 MED ORDER — MEDROXYPROGESTERONE ACETATE 150 MG/ML IM SUSP
150.0000 mg | Freq: Once | INTRAMUSCULAR | Status: AC
  Administered 2017-12-13: 150 mg via INTRAMUSCULAR

## 2017-12-13 NOTE — Progress Notes (Signed)
   Subjective:    Patient ID: Olivia Park, female    DOB: Sep 13, 1985, 32 y.o.   MRN: [id number]  HPI 32 yo single P 2 7(31 yo son and 822 month old son) here today for a depo provera injection and general health maintenance.    Review of Systems She reports that her last pap was a long time ago.    Objective:   Physical Exam  Breathing, conversing, and ambulating normally Well nourished, well hydrated Black female, no apparent distress Cervix appears normal      Assessment & Plan:  Preventative care- pap smear, flu and Gardasil vaccines HTN- refer to fam med H/O GDM- schedule 2 hour GTT

## 2017-12-13 NOTE — Progress Notes (Signed)
   Subjective:    Patient ID: Olivia Park, female    DOB: 06/06/1985, 32 y.o.   MRN: [id number]  HPI See other note   Review of Systems     Objective:   Physical Exam        Assessment & Plan:

## 2017-12-19 LAB — CYTOLOGY - PAP
Diagnosis: NEGATIVE
HPV (WINDOPATH): NOT DETECTED

## 2017-12-20 ENCOUNTER — Other Ambulatory Visit: Payer: Medicaid Other

## 2018-02-13 ENCOUNTER — Ambulatory Visit: Payer: Medicaid Other

## 2018-06-12 ENCOUNTER — Telehealth: Payer: Self-pay | Admitting: Family Medicine

## 2018-06-12 NOTE — Telephone Encounter (Signed)
Called patient to inform her of the office restrictions due to the coronavirus. Patient informed not to come to visit if not feeling well and to call to get rescheduled. Patient verbalized understanding.  

## 2018-06-13 ENCOUNTER — Ambulatory Visit: Payer: Medicaid Other

## 2018-06-20 ENCOUNTER — Other Ambulatory Visit: Payer: Self-pay

## 2018-06-20 ENCOUNTER — Ambulatory Visit (INDEPENDENT_AMBULATORY_CARE_PROVIDER_SITE_OTHER): Payer: Self-pay

## 2018-06-20 VITALS — BP 148/102

## 2018-06-20 DIAGNOSIS — Z23 Encounter for immunization: Secondary | ICD-10-CM

## 2018-06-20 MED ORDER — AMLODIPINE BESYLATE 5 MG PO TABS: 5.0000 mg | ORAL_TABLET | Freq: Every day | ORAL | 0 refills | Status: DC

## 2018-06-20 NOTE — Progress Notes (Signed)
Olivia Park here for second dose of Gardasil.   Injection.  Injection administered without complication. Patient will return in 3 months for next injection. Pt presented today with elevated blood pressure.  Pt reports that she has HTN and has ran out of her medicaiton.  I asked pt if she has seen her PCP.  Pt informed me that no one from the PCP office gave her call.  Per Dr. Marice Potter pt can have a 30 day supply one time only however will have to contact PCP for furture refill. Pt verbalized understanding.  Ralene Bathe, RN 06/20/2018  11:17 AM

## 2018-06-21 NOTE — Progress Notes (Signed)
I have reviewed this chart and agree with the RN/CMA assessment and management.    Gotti Alwin C Ranae Casebier, MD, FACOG Attending Physician, [facility]  

## 2018-09-11 ENCOUNTER — Telehealth: Payer: Self-pay | Admitting: Family Medicine

## 2018-09-11 NOTE — Telephone Encounter (Signed)
Attempted to call patient about her appointment on 6/16 @ 10:20. No answer and was unable to leave a voicemail due to the number stating that the call could not be completed.

## 2018-09-12 ENCOUNTER — Ambulatory Visit: Payer: Medicaid Other

## 2018-09-20 ENCOUNTER — Ambulatory Visit: Payer: Medicaid Other

## 2018-10-17 LAB — PREGNANCY, URINE

## 2018-11-03 ENCOUNTER — Telehealth: Payer: Self-pay | Admitting: Medical

## 2018-11-03 DIAGNOSIS — O219 Vomiting of pregnancy, unspecified: Secondary | ICD-10-CM

## 2018-11-03 NOTE — Telephone Encounter (Signed)
The patient called in stating [facility] and [facility] came [facility] positive. She stated she's been vomiting, she had diarrhea, and feeling fatigue. She also stated its hard for her to get out of the bed. She visits the [facility] [address]. She would like to know what she should do or is there a prescription that can be provided.  Informed the patient of bringing the positive preg. Confirmation to our office to schedule upcoming ob appointment.

## 2018-11-05 ENCOUNTER — Encounter (HOSPITAL_COMMUNITY): Payer: Self-pay

## 2018-11-05 ENCOUNTER — Other Ambulatory Visit: Payer: Self-pay

## 2018-11-05 ENCOUNTER — Inpatient Hospital Stay (HOSPITAL_COMMUNITY)
Admission: AD | Admit: 2018-11-05 | Discharge: 2018-11-05 | Disposition: A | Discharge status: Home / Self Care | Payer: Medicaid Other | Attending: Obstetrics and Gynecology | Admitting: Obstetrics and Gynecology

## 2018-11-05 DIAGNOSIS — R110 Nausea: Secondary | ICD-10-CM | POA: Diagnosis not present

## 2018-11-05 DIAGNOSIS — Z3A01 Less than 8 weeks gestation of pregnancy: Secondary | ICD-10-CM | POA: Insufficient documentation

## 2018-11-05 DIAGNOSIS — Z3491 Encounter for supervision of normal pregnancy, unspecified, first trimester: Secondary | ICD-10-CM

## 2018-11-05 DIAGNOSIS — O219 Vomiting of pregnancy, unspecified: Secondary | ICD-10-CM | POA: Diagnosis not present

## 2018-11-05 DIAGNOSIS — R1110 Vomiting, unspecified: Secondary | ICD-10-CM | POA: Diagnosis not present

## 2018-11-05 DIAGNOSIS — O210 Mild hyperemesis gravidarum: Secondary | ICD-10-CM | POA: Diagnosis not present

## 2018-11-05 DIAGNOSIS — Z3A00 Weeks of gestation of pregnancy not specified: Secondary | ICD-10-CM

## 2018-11-05 DIAGNOSIS — Z331 Pregnant state, incidental: Secondary | ICD-10-CM | POA: Diagnosis not present

## 2018-11-05 DIAGNOSIS — E860 Dehydration: Secondary | ICD-10-CM | POA: Diagnosis not present

## 2018-11-05 DIAGNOSIS — N390 Urinary tract infection, site not specified: Secondary | ICD-10-CM | POA: Diagnosis not present

## 2018-11-05 LAB — URINALYSIS, ROUTINE W REFLEX MICROSCOPIC
Bacteria, UA: NONE SEEN
Bilirubin Urine: NEGATIVE
Glucose, UA: NEGATIVE mg/dL
Hgb urine dipstick: NEGATIVE
Ketones, ur: 20 mg/dL — AB
Nitrite: NEGATIVE
Protein, ur: 100 mg/dL — AB
Specific Gravity, Urine: 1.03 (ref 1.005–1.030)
pH: 6 (ref 5.0–8.0)

## 2018-11-05 LAB — POCT PREGNANCY, URINE: Preg Test, Ur: POSITIVE — AB

## 2018-11-05 MED ORDER — FAMOTIDINE IN NACL 20-0.9 MG/50ML-% IV SOLN
20.0000 mg | Freq: Once | INTRAVENOUS | Status: AC
  Administered 2018-11-05: 20 mg via INTRAVENOUS
  Filled 2018-11-05: qty 50

## 2018-11-05 MED ORDER — DOXYLAMINE-PYRIDOXINE 10-10 MG PO TBEC: DELAYED_RELEASE_TABLET | ORAL | 1 refills | Status: DC

## 2018-11-05 MED ORDER — PROMETHAZINE HCL 25 MG PO TABS: 25.0000 mg | ORAL_TABLET | Freq: Four times a day (QID) | ORAL | 1 refills | Status: DC | PRN

## 2018-11-05 MED ORDER — LACTATED RINGERS IV BOLUS
1000.0000 mL | Freq: Once | INTRAVENOUS | Status: AC
  Administered 2018-11-05: 1000 mL via INTRAVENOUS

## 2018-11-05 MED ORDER — PROMETHAZINE HCL 25 MG/ML IJ SOLN
25.0000 mg | Freq: Once | INTRAMUSCULAR | Status: AC
  Administered 2018-11-05: 25 mg via INTRAVENOUS
  Filled 2018-11-05: qty 1

## 2018-11-05 NOTE — Discharge Instructions (Signed)
Morning Sickness ° °Morning sickness is when a woman feels nauseous during pregnancy. This nauseous feeling may or may not come with vomiting. It often occurs in the morning, but it can be a problem at any time of day. Morning sickness is most common during the first trimester. In some cases, it may continue throughout pregnancy. Although morning sickness is unpleasant, it is usually harmless unless the woman develops severe and continual vomiting (hyperemesis gravidarum), a condition that requires more intense treatment. °What are the causes? °The exact cause of this condition is not known, but it seems to be related to normal hormonal changes that occur in pregnancy. °What increases the risk? °You are more likely to develop this condition if: °· You experienced nausea or vomiting before your pregnancy. °· You had morning sickness during a previous pregnancy. °· You are pregnant with more than one baby, such as twins. °What are the signs or symptoms? °Symptoms of this condition include: °· Nausea. °· Vomiting. °How is this diagnosed? °This condition is usually diagnosed based on your signs and symptoms. °How is this treated? °In many cases, treatment is not needed for this condition. Making some changes to what you eat may help to control symptoms. Your health care provider may also prescribe or recommend: °· Vitamin B6 supplements. °· Anti-nausea medicines. °· Ginger. °Follow these instructions at home: °Medicines °· Take over-the-counter and prescription medicines only as told by your health care provider. Do not use any prescription, over-the-counter, or herbal medicines for morning sickness without first talking with your health care provider. °· Taking multivitamins before getting pregnant can prevent or decrease the severity of morning sickness in most women. °Eating and drinking °· Eat a piece of dry toast or crackers before getting out of bed in the morning. °· Eat 5 or 6 small meals a day. °· Eat dry and  bland foods, such as rice or a baked potato. Foods that are high in carbohydrates are often helpful. °· Avoid greasy, fatty, and spicy foods. °· Have someone cook for you if the smell of any food causes nausea and vomiting. °· If you feel nauseous after taking prenatal vitamins, take the vitamins at night or with a snack. °· Snack on protein foods between meals if you are hungry. Nuts, yogurt, and cheese are good options. °· Drink fluids throughout the day. °· Try ginger ale made with real ginger, ginger tea made from fresh grated ginger, or ginger candies. °General instructions °· Do not use any products that contain nicotine or tobacco, such as cigarettes and e-cigarettes. If you need help quitting, ask your health care provider. °· Get an air purifier to keep the air in your house free of odors. °· Get plenty of fresh air. °· Try to avoid odors that trigger your nausea. °· Consider trying these methods to help relieve symptoms: °? Wearing an acupressure wristband. These wristbands are often worn for seasickness. °? Acupuncture. °Contact a health care provider if: °· Your home remedies are not working and you need medicine. °· You feel dizzy or light-headed. °· You are losing weight. °Get help right away if: °· You have persistent and uncontrolled nausea and vomiting. °· You faint. °· You have severe pain in your abdomen. °Summary °· Morning sickness is when a woman feels nauseous during pregnancy. This nauseous feeling may or may not come with vomiting. °· Morning sickness is most common during the first trimester. °· It often occurs in the morning, but it can be a problem at   any time of day. °· In many cases, treatment is not needed for this condition. Making some changes to what you eat may help to control symptoms. °This information is not intended to replace advice given to you by your health care provider. Make sure you discuss any questions you have with your health care provider. °Document Released:  05/06/2006 Document Revised: 02/25/2017 Document Reviewed: 04/17/2016 °Elsevier Patient Education © 2020 Elsevier Inc. ° °

## 2018-11-05 NOTE — MAU Note (Signed)
Olivia Park is a 33 y.o. at Unknown here in MAU reporting: for about 1.5 she has been vomiting 4-5 times a day, sometimes sees some blood or "yellow stuff". States she can't keep anything down. No abdominal pain, vag bleeding or discharge.   LMP: unknown, states she was on depo and her periods were irregular so unsure of when last one was  Onset of complaint: ongoing for 1.5 weeks  Pain score: 0/10  Vitals:   11/05/18 1409  BP: 128/78  Pulse: 87  Resp: 18  Temp: 98.8 F (37.1 C)  SpO2: 100%      Lab orders placed from triage: UA, UPT

## 2018-11-05 NOTE — MAU Provider Note (Signed)
Chief Complaint: Emesis and Nausea   First Provider Initiated Contact with Patient 11/05/18 1434     SUBJECTIVE HPI: Olivia Park is a 33 y.o. [id number] at Unknown who presents to Maternity Admissions reporting nausea & vomiting. LMP unknown d/t hx of depo provera use. Had [facility] pregnancy [facility] on [facility]. Has prenatal care at [facility] scheduled for next month.  Reports vomiting 4+ times per day for the last 1.5 wks. Also has had 1 watery stool per day. Does not have antiemetic at home & hasn't tried any OTC remedies. Denies abdominal pain, vaginal bleeding, or fever.    Past Medical History:  Diagnosis Date  . Gestational diabetes   . Pregnancy induced hypertension    OB History  Gravida Para Term Preterm AB Living  3 2 0 2   2  SAB TAB Ectopic Multiple Live Births        0 1    # Outcome Date GA Lbr Len/2nd Weight Sex Delivery Anes PTL Lv  3 Current           2 Preterm 09/22/17 [id number] 04:14 / 00:05 2615 g M Vag-Spont None  LIV  1 Preterm     M Vag-Spont      Past Surgical History:  Procedure Laterality Date  . NO PAST SURGERIES     Social History   Socioeconomic History  . Marital status: Single    Spouse name: Not on file  . Number of children: Not on file  . Years of education: Not on file  . Highest education level: Not on file  Occupational History  . Not on file  Social Needs  . Financial resource strain: Not on file  . Food insecurity    Worry: Not on file    Inability: Not on file  . Transportation needs    Medical: Not on file    Non-medical: Not on file  Tobacco Use  . Smoking status: Never Smoker  . Smokeless tobacco: Never Used  Substance and Sexual Activity  . Alcohol use: No    Comment: socially  . Drug use: No  . Sexual activity: Yes    Birth control/protection: None  Lifestyle  . Physical activity    Days per week: Not on file    Minutes per session: Not on file  . Stress: Not on file  Relationships  . Social Wellsite geologistconnections     Talks on phone: Not on file    Gets together: Not on file    Attends religious service: Not on file    Active member of club or organization: Not on file    Attends meetings of clubs or organizations: Not on file    Relationship status: Not on file  . Intimate partner violence    Fear of current or ex partner: Not on file    Emotionally abused: Not on file    Physically abused: Not on file    Forced sexual activity: Not on file  Other Topics Concern  . Not on file  Social History Narrative  . Not on file   History reviewed. No pertinent family history. No current facility-administered medications on file prior to encounter.    Current Outpatient Medications on File Prior to Encounter  Medication Sig Dispense Refill  . amLODipine (NORVASC) 5 MG tablet Take 1 tablet (5 mg total) by mouth daily. 30 tablet 0  . ibuprofen (ADVIL,MOTRIN) 600 MG tablet Take 1 tablet (600 mg total) by mouth every 6 (six)  hours. (Patient not taking: Reported on 12/13/2017) 30 tablet 0  . Prenatal MV-Min-FA-Omega-3 (PRENATAL GUMMIES/DHA & FA PO) Take 2 tablets by mouth daily.     No Known Allergies  I have reviewed patient's Past Medical Hx, Surgical Hx, Family Hx, Social Hx, medications and allergies.   Review of Systems  Constitutional: Negative.   Gastrointestinal: Positive for diarrhea, nausea and vomiting. Negative for abdominal pain, blood in stool and constipation.  Genitourinary: Negative.     OBJECTIVE Patient Vitals for the past 24 hrs:  BP Temp Temp src Pulse Resp SpO2  11/05/18 1409 128/78 98.8 F (37.1 C) Oral 87 18 100 %   Constitutional: Well-developed, well-nourished female in no acute distress.  Cardiovascular: normal rate & rhythm, no murmur Respiratory: normal rate and effort. Lung sounds clear throughout GI: Abd soft, non-tender, Pos BS x 4. No guarding or rebound tenderness MS: Extremities nontender, no edema, normal ROM Neurologic: Alert and oriented x 4.     LAB  RESULTS Results for orders placed or performed during the hospital encounter of 11/05/18 (from the past 24 hour(s))  Urinalysis, Routine w reflex microscopic     Status: Abnormal   Collection Time: 11/05/18  2:02 PM  Result Value Ref Range   Color, Urine YELLOW YELLOW   APPearance CLOUDY (A) CLEAR   Specific Gravity, Urine 1.030 1.005 - 1.030   pH 6.0 5.0 - 8.0   Glucose, UA NEGATIVE NEGATIVE mg/dL   Hgb urine dipstick NEGATIVE NEGATIVE   Bilirubin Urine NEGATIVE NEGATIVE   Ketones, ur 20 (A) NEGATIVE mg/dL   Protein, ur 161100 (A) NEGATIVE mg/dL   Nitrite NEGATIVE NEGATIVE   Leukocytes,Ua LARGE (A) NEGATIVE   RBC / HPF 0-5 0 - 5 RBC/hpf   WBC, UA 21-50 0 - 5 WBC/hpf   Bacteria, UA NONE SEEN NONE SEEN   Squamous Epithelial / LPF 21-50 0 - 5   Mucus PRESENT    Hyaline Casts, UA PRESENT   Pregnancy, urine POC     Status: Abnormal   Collection Time: 11/05/18  2:33 PM  Result Value Ref Range   Preg Test, Ur POSITIVE (A) NEGATIVE    IMAGING No results found.  MAU COURSE Orders Placed This Encounter  Procedures  . US PELVIC COMPLETE WITH TRANSVAGINAL  . Urinalysis, Routine w reflex microscopic  . Pregnancy, urine POC  . Discharge patient   Meds ordered this encounter  Medications  . promethazine (PHENERGAN) injection 25 mg  . lactated ringers bolus 1,000 mL  . famotidine (PEPCID) IVPB 20 mg premix  . promethazine (PHENERGAN) 25 MG tablet    Sig: Take 1 tablet (25 mg total) by mouth every 6 (six) hours as needed for nausea or vomiting.    Dispense:  30 tablet    Refill:  1    Order Specific Question:   Supervising Provider    Answer:   ERVIN, MICHAEL L [[id number]]  . Doxylamine-Pyridoxine 10-10 MG TBEC    Sig: Take 2 tabs at bedtime. If needed, add another tab in the morning. If needed, add another tab in the afternoon, up to 4 tabs/day.    Dispense:  60 tablet    Refill:  1    Order Specific Question:   Supervising Provider    Answer:   ERVIN, MICHAEL L [[id number]]    MDM No  abdominal pain or vaginal bleeding. Patient has new ob appt end of September at [facility]. Will order outpatient ultrasound for viability & dating. Pt has unknown LMP & +  UPT the end of June.   U/a with signs of dehydration. Given IV LR bolus, phenergan, & pepcid. Pt reports feeling better & able to keep down crackers. Will send home with Rx phenergan & diclegis.   ASSESSMENT 1. Nausea and vomiting during pregnancy prior to [redacted] weeks gestation   2. Currently pregnant in first trimester with unknown date of last menstrual period     PLAN Discharge home in stable condition. Discussed reasons to return to MAU Outpatient ultrasound ordered Rx phenergan & diclegis  Follow-up Information    [facility] Follow up.   Specialty: Obstetrics and Gynecology Why: return for worsening symptoms Contact information: [address] [id number] [place] [phone]         Allergies as of 11/05/2018   No Known Allergies     Medication List    STOP taking these medications   amLODipine 5 MG tablet Commonly known as: Norvasc   ibuprofen 600 MG tablet Commonly known as: ADVIL     TAKE these medications   Doxylamine-Pyridoxine 10-10 MG Tbec Take 2 tabs at bedtime. If needed, add another tab in the morning. If needed, add another tab in the afternoon, up to 4 tabs/day.   PRENATAL GUMMIES/DHA & FA PO Take 2 tablets by mouth daily.   promethazine 25 MG tablet Commonly known as: PHENERGAN Take 1 tablet (25 mg total) by mouth every 6 (six) hours as needed for nausea or vomiting.        Jorje Guild, NP 11/05/2018  4:55 PM

## 2018-11-06 ENCOUNTER — Other Ambulatory Visit (HOSPITAL_COMMUNITY): Payer: Self-pay | Admitting: Student

## 2018-11-07 ENCOUNTER — Other Ambulatory Visit: Payer: Self-pay | Admitting: Medical

## 2018-11-07 DIAGNOSIS — Z3491 Encounter for supervision of normal pregnancy, unspecified, first trimester: Secondary | ICD-10-CM

## 2018-11-07 NOTE — Telephone Encounter (Signed)
Pt reports she is feeling a little better. She is still having nausea and vomiting but is better. She is taking the Phenergan and reports it helps with the N&V but causes her to be drowsy.   Pt reports she is having difficulty keeping fluids down. Enc pt to try crackers and try a carbonated beverage. She has follow up Sept 1 with a provider.   Pt to let us know if she continues to have difficulty.

## 2018-11-14 ENCOUNTER — Telehealth: Payer: Self-pay | Admitting: Family Medicine

## 2018-11-14 NOTE — Telephone Encounter (Signed)
Spoke to patient about her appointment on 8/19 @ 10:40. Patient instructed to wear a face mask for the entire appointment and no visitors are allowed with her during the visit. Patient screened for covid symptoms and denied having any

## 2018-11-15 ENCOUNTER — Ambulatory Visit (HOSPITAL_COMMUNITY)
Admission: RE | Admit: 2018-11-15 | Discharge: 2018-11-15 | Disposition: A | Discharge status: Home / Self Care | Payer: Medicaid Other | Source: Ambulatory Visit | Attending: Student | Admitting: Student

## 2018-11-15 ENCOUNTER — Encounter: Payer: Self-pay | Admitting: Medical

## 2018-11-15 ENCOUNTER — Ambulatory Visit (INDEPENDENT_AMBULATORY_CARE_PROVIDER_SITE_OTHER): Payer: Medicaid Other

## 2018-11-15 ENCOUNTER — Other Ambulatory Visit: Payer: Self-pay | Admitting: Medical

## 2018-11-15 ENCOUNTER — Other Ambulatory Visit: Payer: Self-pay

## 2018-11-15 DIAGNOSIS — Z3491 Encounter for supervision of normal pregnancy, unspecified, first trimester: Secondary | ICD-10-CM

## 2018-11-15 DIAGNOSIS — Z712 Person consulting for explanation of examination or test findings: Secondary | ICD-10-CM

## 2018-11-15 DIAGNOSIS — O3680X1 Pregnancy with inconclusive fetal viability, fetus 1: Secondary | ICD-10-CM | POA: Diagnosis not present

## 2018-11-15 DIAGNOSIS — Z3A08 8 weeks gestation of pregnancy: Secondary | ICD-10-CM | POA: Diagnosis not present

## 2018-11-15 NOTE — Progress Notes (Signed)
Patient seen and assessed by nursing staff.  Agree with documentation and plan.  

## 2018-11-15 NOTE — Progress Notes (Signed)
Pt here today for pregnancy [place] results.  Pt denies any pain or bleeding.  Notified Dr. Kennon Rounds pt's [place] results- no new orders.  Medications reconciled.  Pt informed that she has a viable pregnancy with EDD 06/25/19 and 8w 2d today.  Proof of pregnancy letter provided by the front office.  Pt verbalized understanding.   Mel Almond, RN 11/15/18

## 2018-11-16 ENCOUNTER — Inpatient Hospital Stay (HOSPITAL_COMMUNITY)
Admission: AD | Admit: 2018-11-16 | Discharge: 2018-11-16 | Disposition: A | Discharge status: Home / Self Care | Payer: Medicaid Other | Attending: Obstetrics and Gynecology | Admitting: Obstetrics and Gynecology

## 2018-11-16 ENCOUNTER — Encounter: Payer: Self-pay | Admitting: *Deleted

## 2018-11-16 ENCOUNTER — Encounter: Payer: Self-pay | Admitting: General Practice

## 2018-11-16 ENCOUNTER — Other Ambulatory Visit: Payer: Self-pay

## 2018-11-16 ENCOUNTER — Encounter (HOSPITAL_COMMUNITY): Payer: Self-pay | Admitting: *Deleted

## 2018-11-16 DIAGNOSIS — O99281 Endocrine, nutritional and metabolic diseases complicating pregnancy, first trimester: Secondary | ICD-10-CM | POA: Insufficient documentation

## 2018-11-16 DIAGNOSIS — O9989 Other specified diseases and conditions complicating pregnancy, childbirth and the puerperium: Secondary | ICD-10-CM | POA: Diagnosis not present

## 2018-11-16 DIAGNOSIS — Z3A08 8 weeks gestation of pregnancy: Secondary | ICD-10-CM | POA: Diagnosis not present

## 2018-11-16 DIAGNOSIS — Z3A09 9 weeks gestation of pregnancy: Secondary | ICD-10-CM | POA: Insufficient documentation

## 2018-11-16 DIAGNOSIS — O219 Vomiting of pregnancy, unspecified: Secondary | ICD-10-CM | POA: Diagnosis not present

## 2018-11-16 DIAGNOSIS — E860 Dehydration: Secondary | ICD-10-CM | POA: Insufficient documentation

## 2018-11-16 DIAGNOSIS — R112 Nausea with vomiting, unspecified: Secondary | ICD-10-CM | POA: Insufficient documentation

## 2018-11-16 LAB — URINALYSIS, ROUTINE W REFLEX MICROSCOPIC
Bilirubin Urine: NEGATIVE
Glucose, UA: NEGATIVE mg/dL
Hgb urine dipstick: NEGATIVE
Ketones, ur: 5 mg/dL — AB
Nitrite: NEGATIVE
Protein, ur: 100 mg/dL — AB
Specific Gravity, Urine: 1.026 (ref 1.005–1.030)
pH: 7 (ref 5.0–8.0)

## 2018-11-16 LAB — WET PREP, GENITAL
Clue Cells Wet Prep HPF POC: NONE SEEN
Sperm: NONE SEEN
Trich, Wet Prep: NONE SEEN
Yeast Wet Prep HPF POC: NONE SEEN

## 2018-11-16 MED ORDER — LACTATED RINGERS IV BOLUS
1000.0000 mL | Freq: Once | INTRAVENOUS | Status: AC
  Administered 2018-11-16: 1000 mL via INTRAVENOUS

## 2018-11-16 MED ORDER — PANTOPRAZOLE SODIUM 40 MG IV SOLR
40.0000 mg | Freq: Once | INTRAVENOUS | Status: AC
  Administered 2018-11-16: 40 mg via INTRAVENOUS
  Filled 2018-11-16: qty 40

## 2018-11-16 MED ORDER — GLYCOPYRROLATE 1 MG PO TABS: 1.0000 mg | ORAL_TABLET | Freq: Three times a day (TID) | ORAL | 1 refills | Status: DC

## 2018-11-16 MED ORDER — PROMETHAZINE HCL 25 MG/ML IJ SOLN
12.5000 mg | Freq: Once | INTRAMUSCULAR | Status: AC
  Administered 2018-11-16: 12.5 mg via INTRAVENOUS
  Filled 2018-11-16: qty 1

## 2018-11-16 MED ORDER — PROMETHAZINE HCL 25 MG RE SUPP: 25.0000 mg | Freq: Four times a day (QID) | RECTAL | 0 refills | Status: DC | PRN

## 2018-11-16 NOTE — MAU Note (Signed)
Pt reports eh has been having n/v since beginning of August. Can't keep anything down. Feel like she is loosing weight and feels weak. Has spitting as well.Can't keep phenergan down.

## 2018-11-16 NOTE — Discharge Instructions (Signed)
Hyperemesis Gravidarum °Hyperemesis gravidarum is a severe form of nausea and vomiting that happens during pregnancy. Hyperemesis is worse than morning sickness. It may cause you to have nausea or vomiting all day for many days. It may keep you from eating and drinking enough food and liquids, which can lead to dehydration, malnutrition, and weight loss. Hyperemesis usually occurs during the first half (the first 20 weeks) of pregnancy. It often goes away once a woman is in her second half of pregnancy. However, sometimes hyperemesis continues through an entire pregnancy. °What are the causes? °The cause of this condition is not known. It may be related to changes in chemicals (hormones) in the body during pregnancy, such as the high level of pregnancy hormone (human chorionic gonadotropin) or the increase in the female sex hormone (estrogen). °What are the signs or symptoms? °Symptoms of this condition include: °· Nausea that does not go away. °· Vomiting that does not allow you to keep any food down. °· Weight loss. °· Body fluid loss (dehydration). °· Having no desire to eat, or not liking food that you have previously enjoyed. °How is this diagnosed? °This condition may be diagnosed based on: °· A physical exam. °· Your medical history. °· Your symptoms. °· Blood tests. °· Urine tests. °How is this treated? °This condition is managed by controlling symptoms. This may include: °· Following an eating plan. This can help lessen nausea and vomiting. °· Taking prescription medicines. °An eating plan and medicines are often used together to help control symptoms. If medicines do not help relieve nausea and vomiting, you may need to receive fluids through an IV at the hospital. °Follow these instructions at home: °Eating and drinking ° °· Avoid the following: °? Drinking fluids with meals. Try not to drink anything during the 30 minutes before and after your meals. °? Drinking more than 1 cup of fluid at a  time. °? Eating foods that trigger your symptoms. These may include spicy foods, coffee, high-fat foods, very sweet foods, and acidic foods. °? Skipping meals. Nausea can be more intense on an empty stomach. If you cannot tolerate food, do not force it. Try sucking on ice chips or other frozen items and make up for missed calories later. °? Lying down within 2 hours after eating. °? Being exposed to environmental triggers. These may include food smells, smoky rooms, closed spaces, rooms with strong smells, warm or humid places, overly loud and noisy rooms, and rooms with motion or flickering lights. Try eating meals in a well-ventilated area that is free of strong smells. °? Quick and sudden changes in your movement. °? Taking iron pills and multivitamins that contain iron. If you take prescription iron pills, do not stop taking them unless your health care provider approves. °? Preparing food. The smell of food can spoil your appetite or trigger nausea. °· To help relieve your symptoms: °? Listen to your body. Everyone is different and has different preferences. Find what works best for you. °? Eat and drink slowly. °? Eat 5-6 small meals daily instead of 3 large meals. Eating small meals and snacks can help you avoid an empty stomach. °? In the morning, before getting out of bed, eat a couple of crackers to avoid moving around on an empty stomach. °? Try eating starchy foods as these are usually tolerated well. Examples include cereal, toast, bread, potatoes, pasta, rice, and pretzels. °? Include at least 1 serving of protein with your meals and snacks. Protein options include   lean meats, poultry, seafood, beans, nuts, nut butters, eggs, cheese, and yogurt. °? Try eating a protein-rich snack before bed. Examples of a protein-rick snack include cheese and crackers or a peanut butter sandwich made with 1 slice of whole-wheat bread and 1 tsp (5 g) of peanut butter. °? Eat or suck on things that have ginger in them.  It may help relieve nausea. Add ¼ tsp ground ginger to hot tea or choose ginger tea. °? Try drinking 100% fruit juice or an electrolyte drink. An electrolyte drink contains sodium, potassium, and chloride. °? Drink fluids that are cold, clear, and carbonated or sour. Examples include lemonade, ginger ale, lemon-lime soda, ice water, and sparkling water. °? Brush your teeth or use a mouth rinse after meals. °? Talk with your health care provider about starting a supplement of vitamin B6. °General instructions °· Take over-the-counter and prescription medicines only as told by your health care provider. °· Follow instructions from your health care provider about eating or drinking restrictions. °· Continue to take your prenatal vitamins as told by your health care provider. If you are having trouble taking your prenatal vitamins, talk with your health care provider about different options. °· Keep all follow-up and pre-birth (prenatal) visits as told by your health care provider. This is important. °Contact a health care provider if: °· You have pain in your abdomen. °· You have a severe headache. °· You have vision problems. °· You are losing weight. °· You feel weak or dizzy. °Get help right away if: °· You cannot drink fluids without vomiting. °· You vomit blood. °· You have constant nausea and vomiting. °· You are very weak. °· You faint. °· You have a fever and your symptoms suddenly get worse. °Summary °· Hyperemesis gravidarum is a severe form of nausea and vomiting that happens during pregnancy. °· Making some changes to your eating habits may help relieve nausea and vomiting. °· This condition may be managed with medicine. °· If medicines do not help relieve nausea and vomiting, you may need to receive fluids through an IV at the hospital. °This information is not intended to replace advice given to you by your health care provider. Make sure you discuss any questions you have with your health care  provider. °Document Released: 03/15/2005 Document Revised: 04/04/2017 Document Reviewed: 11/12/2015 °Elsevier Patient Education © 2020 [employer] ° ° °

## 2018-11-16 NOTE — MAU Provider Note (Signed)
History     CSN: [id number]  Arrival date and time: 11/16/18 1034   Chief Complaint  Patient presents with  . Emesis    Olivia Park is a 33 year old [id number] who reports to maternity assessment unit for vomiting. She says that vomiting started around the beginning of August. Vomiting occurs 6 times per day. It is described as "bright yellow color". Has been increasing in frequency recently. Patient is also very nauseous and spitting. She was given a prescription for phenergan on 11/05/2018 but has been unable to keep it down. Denies fever or any abdominal pain.   Patient says she has noticed her urine is darker in color. She has also been feeling dizzy and reports a 20 pound weight loss. She is trying to drink fluids and eat but nothing will stay down. Denies any burning with urination, but does feel like she has to go to the bathroom more often and only gets a few dribbles out at a time. States she has not noticed any bloody discharge, but increase in white discharge which she thinks may be BV.   Past Medical History:  Diagnosis Date  . Gestational diabetes   . Pregnancy induced hypertension     Past Surgical History:  Procedure Laterality Date  . NO PAST SURGERIES      History reviewed. No pertinent family history.  Social History   Tobacco Use  . Smoking status: Never Smoker  . Smokeless tobacco: Never Used  Substance Use Topics  . Alcohol use: No    Comment: socially  . Drug use: No    Allergies: No Known Allergies  Medications Prior to Admission  Medication Sig Dispense Refill Last Dose  . Doxylamine-Pyridoxine 10-10 MG TBEC Take 2 tabs at bedtime. If needed, add another tab in the morning. If needed, add another tab in the afternoon, up to 4 tabs/day. 60 tablet 1 11/16/2018 at Unknown time  . Prenatal MV-Min-FA-Omega-3 (PRENATAL GUMMIES/DHA & FA PO) Take 2 tablets by mouth daily.   11/16/2018 at Unknown time  . promethazine (PHENERGAN) 25 MG tablet Take 1 tablet  (25 mg total) by mouth every 6 (six) hours as needed for nausea or vomiting. 30 tablet 1 11/16/2018 at Unknown time    Review of Systems  Constitutional: Negative for fever.  Gastrointestinal: Positive for nausea and vomiting. Negative for abdominal pain.   Physical Exam   Blood pressure (!) 137/94, pulse 89, temperature 98.7 F (37.1 C), resp. rate 18, height 5\' 5"  (1.651 m), weight 97.1 kg, last menstrual period 09/18/2018, currently breastfeeding.  Physical Exam  Constitutional: She is oriented to person, place, and time. She appears well-developed and well-nourished.  Respiratory: Effort normal.  GI: Soft. Bowel sounds are normal. There is no abdominal tenderness.  Musculoskeletal: Normal range of motion.  Neurological: She is alert and oriented to person, place, and time.  Skin: Skin is warm and dry.    MAU Course   MDM  Physical exam performed. U/A and Wet Prep unremarkable. IV was started and patient was given fluids, phenergan, and protonix. Patient reports feeling better after these interventions and was discharged. Encouraged to return to MAU if symptoms worsen or if she develops additional symptoms.    Assessment and Plan  Vomiting: -IV Phenergan given today, pt sent home with prescription for Phenergan suppositories -Protonix IV given today  Dehydration: -IV LR Bolus given today -Patient encouraged to try and drink fluids to maintain hydration  Ptyalism: -Prescription sent for Robinul   Pregnancy: -  Patient encouraged to follow up with outpatient [place] 11/16/2018, 11:52 AM

## 2018-11-27 ENCOUNTER — Telehealth: Payer: Self-pay | Admitting: Family Medicine

## 2018-11-27 NOTE — Telephone Encounter (Signed)
Spoke to patient about her appointment on 9/1 @ 8:30. Patient instructed that this visit will be a phone visit and she does not have to come to the office for this appointment. Patient instructed to be available around her appointment. Patient verbalized understanding.

## 2018-11-28 ENCOUNTER — Ambulatory Visit (INDEPENDENT_AMBULATORY_CARE_PROVIDER_SITE_OTHER): Payer: Medicaid Other | Admitting: *Deleted

## 2018-11-28 ENCOUNTER — Telehealth: Payer: Self-pay | Admitting: General Practice

## 2018-11-28 ENCOUNTER — Other Ambulatory Visit: Payer: Self-pay

## 2018-11-28 DIAGNOSIS — Z8759 Personal history of other complications of pregnancy, childbirth and the puerperium: Secondary | ICD-10-CM

## 2018-11-28 DIAGNOSIS — B9689 Other specified bacterial agents as the cause of diseases classified elsewhere: Secondary | ICD-10-CM

## 2018-11-28 DIAGNOSIS — Z8632 Personal history of gestational diabetes: Secondary | ICD-10-CM

## 2018-11-28 DIAGNOSIS — N760 Acute vaginitis: Secondary | ICD-10-CM

## 2018-11-28 DIAGNOSIS — O09899 Supervision of other high risk pregnancies, unspecified trimester: Secondary | ICD-10-CM

## 2018-11-28 DIAGNOSIS — O0990 Supervision of high risk pregnancy, unspecified, unspecified trimester: Secondary | ICD-10-CM

## 2018-11-28 MED ORDER — METRONIDAZOLE 500 MG PO TABS: 500.0000 mg | ORAL_TABLET | Freq: Two times a day (BID) | ORAL | 0 refills | Status: DC

## 2018-11-28 MED ORDER — BLOOD PRESSURE KIT DEVI: 1.0000 | 0 refills | Status: DC | PRN

## 2018-11-28 NOTE — Telephone Encounter (Signed)
Called patient stating I am following up from her new OB intake appt this morning. Told patient I understand she has been having some discharge, could she elaborate on that for me. Patient reports discharge with odor for the past week to week and a half. She states she has had BV before and gets it quite frequently- she thinks that is what she currently has. Told patient I would send Rx to pharmacy for her that she can go by and pick up. Asked she call us back if symptoms do not improve. She verbalized understanding & had no questions.

## 2018-11-28 NOTE — Progress Notes (Signed)
I connected with  Olivia Park on 11/28/18 at  8:30 AM EDT by telephone and verified that I am speaking with the correct person using two identifiers.   I discussed the limitations, risks, security and privacy concerns of performing an evaluation and management service by telephone and the availability of in person appointments. I also discussed with the patient that there may be a patient responsible charge related to this service. The patient expressed understanding and agreed to proceed.  New Ob intake completed. Pt was advised that her prenatal care will include a combination of virtual as well as face to face visits. She agreed to check her BP once weekly and will record results in [place] app.  Pt states she is having frequent nausea and vomiting. She is using Phenergan suppositories 1-2 times daily with fair results. Pt has not yet tried Diclegis and was advised this may provide better results. Last pap done 11/2017 - normal.  Pt has Hx of GDM - diet controlled as well as GHTN which developed to Pre-e and subsequent PTD with pregnancy in 2019. Next office visit on 9/28. Covid restrictions explained. Pt voiced understanding of all information and instructions given.   Day, Ronnell Freshwater, RN 11/28/2018  11:10 AM

## 2018-11-29 DIAGNOSIS — O09899 Supervision of other high risk pregnancies, unspecified trimester: Secondary | ICD-10-CM | POA: Insufficient documentation

## 2018-11-30 NOTE — Progress Notes (Signed)
Patient seen and assessed by nursing staff during this encounter. I have reviewed the chart and agree with the documentation and plan.  Ilanna Deihl, MD 11/30/2018 1:17 PM    

## 2018-12-01 DIAGNOSIS — Z3490 Encounter for supervision of normal pregnancy, unspecified, unspecified trimester: Secondary | ICD-10-CM | POA: Diagnosis not present

## 2018-12-13 ENCOUNTER — Encounter: Payer: Self-pay | Admitting: *Deleted

## 2018-12-13 ENCOUNTER — Ambulatory Visit (INDEPENDENT_AMBULATORY_CARE_PROVIDER_SITE_OTHER): Payer: Medicaid Other | Admitting: Advanced Practice Midwife

## 2018-12-13 ENCOUNTER — Other Ambulatory Visit: Payer: Self-pay

## 2018-12-13 ENCOUNTER — Other Ambulatory Visit: Payer: Self-pay | Admitting: Advanced Practice Midwife

## 2018-12-13 ENCOUNTER — Encounter: Payer: Self-pay | Admitting: Advanced Practice Midwife

## 2018-12-13 ENCOUNTER — Other Ambulatory Visit (HOSPITAL_COMMUNITY)
Admission: RE | Admit: 2018-12-13 | Discharge: 2018-12-13 | Disposition: A | Discharge status: Home / Self Care | Payer: Medicaid Other | Source: Ambulatory Visit | Attending: Advanced Practice Midwife | Admitting: Advanced Practice Midwife

## 2018-12-13 VITALS — BP 137/86 | HR 87 | Wt 212.0 lb

## 2018-12-13 DIAGNOSIS — Z3401 Encounter for supervision of normal first pregnancy, first trimester: Secondary | ICD-10-CM | POA: Insufficient documentation

## 2018-12-13 DIAGNOSIS — O09899 Supervision of other high risk pregnancies, unspecified trimester: Secondary | ICD-10-CM

## 2018-12-13 DIAGNOSIS — O09891 Supervision of other high risk pregnancies, first trimester: Secondary | ICD-10-CM | POA: Diagnosis not present

## 2018-12-13 DIAGNOSIS — O219 Vomiting of pregnancy, unspecified: Secondary | ICD-10-CM

## 2018-12-13 DIAGNOSIS — O0991 Supervision of high risk pregnancy, unspecified, first trimester: Secondary | ICD-10-CM

## 2018-12-13 DIAGNOSIS — O09211 Supervision of pregnancy with history of pre-term labor, first trimester: Secondary | ICD-10-CM

## 2018-12-13 DIAGNOSIS — O0990 Supervision of high risk pregnancy, unspecified, unspecified trimester: Secondary | ICD-10-CM

## 2018-12-13 DIAGNOSIS — Z8632 Personal history of gestational diabetes: Secondary | ICD-10-CM

## 2018-12-13 DIAGNOSIS — Z3481 Encounter for supervision of other normal pregnancy, first trimester: Secondary | ICD-10-CM | POA: Diagnosis not present

## 2018-12-13 DIAGNOSIS — Z8759 Personal history of other complications of pregnancy, childbirth and the puerperium: Secondary | ICD-10-CM | POA: Diagnosis not present

## 2018-12-13 DIAGNOSIS — Z3A12 12 weeks gestation of pregnancy: Secondary | ICD-10-CM | POA: Diagnosis not present

## 2018-12-13 DIAGNOSIS — Z3480 Encounter for supervision of other normal pregnancy, unspecified trimester: Secondary | ICD-10-CM | POA: Diagnosis not present

## 2018-12-13 MED ORDER — ASPIRIN EC 81 MG PO TBEC: 81.0000 mg | DELAYED_RELEASE_TABLET | Freq: Every day | ORAL | 2 refills | Status: DC

## 2018-12-13 NOTE — Patient Instructions (Signed)
Second Trimester of Pregnancy  The second trimester is from week 14 through week 27 (month 4 through 6). This is often the time in pregnancy that you feel your best. Often times, morning sickness has lessened or quit. You may have more energy, and you may get hungry more often. Your unborn baby is growing rapidly. At the end of the sixth month, he or she is about 9 inches long and weighs about 1 pounds. You will likely feel the baby move between 18 and 20 weeks of pregnancy. Follow these instructions at home: Medicines  Take over-the-counter and prescription medicines only as told by your doctor. Some medicines are safe and some medicines are not safe during pregnancy.  Take a prenatal vitamin that contains at least 600 micrograms (mcg) of folic acid.  If you have trouble pooping (constipation), take medicine that will make your stool soft (stool softener) if your doctor approves. Eating and drinking   Eat regular, healthy meals.  Avoid raw meat and uncooked cheese.  If you get low calcium from the food you eat, talk to your doctor about taking a daily calcium supplement.  Avoid foods that are high in fat and sugars, such as fried and sweet foods.  If you feel sick to your stomach (nauseous) or throw up (vomit): ? Eat 4 or 5 small meals a day instead of 3 large meals. ? Try eating a few soda crackers. ? Drink liquids between meals instead of during meals.  To prevent constipation: ? Eat foods that are high in fiber, like fresh fruits and vegetables, whole grains, and beans. ? Drink enough fluids to keep your pee (urine) clear or pale yellow. Activity  Exercise only as told by your doctor. Stop exercising if you start to have cramps.  Do not exercise if it is too hot, too humid, or if you are in a place of great height (high altitude).  Avoid heavy lifting.  Wear low-heeled shoes. Sit and stand up straight.  You can continue to have sex unless your doctor tells you not to.  Relieving pain and discomfort  Wear a good support bra if your breasts are tender.  Take warm water baths (sitz baths) to soothe pain or discomfort caused by hemorrhoids. Use hemorrhoid cream if your doctor approves.  Rest with your legs raised if you have leg cramps or low back pain.  If you develop puffy, bulging veins (varicose veins) in your legs: ? Wear support hose or compression stockings as told by your doctor. ? Raise (elevate) your feet for 15 minutes, 3-4 times a day. ? Limit salt in your food. Prenatal care  Write down your questions. Take them to your prenatal visits.  Keep all your prenatal visits as told by your doctor. This is important. Safety  Wear your seat belt when driving.  Make a list of emergency phone numbers, including numbers for family, friends, the hospital, and police and fire departments. General instructions  Ask your doctor about the right foods to eat or for help finding a counselor, if you need these services.  Ask your doctor about local prenatal classes. Begin classes before month 6 of your pregnancy.  Do not use hot tubs, steam rooms, or saunas.  Do not douche or use tampons or scented sanitary pads.  Do not cross your legs for long periods of time.  Visit your dentist if you have not done so. Use a soft toothbrush to brush your teeth. Floss gently.  Avoid all smoking, herbs,   and alcohol. Avoid drugs that are not approved by your doctor.  Do not use any products that contain nicotine or tobacco, such as cigarettes and e-cigarettes. If you need help quitting, ask your doctor.  Avoid cat litter boxes and soil used by cats. These carry germs that can cause birth defects in the baby and can cause a loss of your baby (miscarriage) or stillbirth. Contact a doctor if:  You have mild cramps or pressure in your lower belly.  You have pain when you pee (urinate).  You have bad smelling fluid coming from your vagina.  You continue to feel  sick to your stomach (nauseous), throw up (vomit), or have watery poop (diarrhea).  You have a nagging pain in your belly area.  You feel dizzy. Get help right away if:  You have a fever.  You are leaking fluid from your vagina.  You have spotting or bleeding from your vagina.  You have severe belly cramping or pain.  You lose or gain weight rapidly.  You have trouble catching your breath and have chest pain.  You notice sudden or extreme puffiness (swelling) of your face, hands, ankles, feet, or legs.  You have not felt the baby move in over an hour.  You have severe headaches that do not go away when you take medicine.  You have trouble seeing. Summary  The second trimester is from week 14 through week 27 (months 4 through 6). This is often the time in pregnancy that you feel your best.  To take care of yourself and your unborn baby, you will need to eat healthy meals, take medicines only if your doctor tells you to do so, and do activities that are safe for you and your baby.  Call your doctor if you get sick or if you notice anything unusual about your pregnancy. Also, call your doctor if you need help with the right food to eat, or if you want to know what activities are safe for you. This information is not intended to replace advice given to you by your health care provider. Make sure you discuss any questions you have with your health care provider. Document Released: 06/09/2009 Document Revised: 07/07/2018 Document Reviewed: 04/20/2016 Elsevier Patient Education  [place] Medications in Pregnancy   Acne: Benzoyl Peroxide Salicylic Acid  Backache/Headache: Tylenol: 2 regular strength every 4 hours OR              2 Extra strength every 6 hours  Colds/Coughs/Allergies: Benadryl (alcohol free) 25 mg every 6 hours as needed Breath right strips Claritin Cepacol throat lozenges Chloraseptic throat spray Cold-Eeze- up to three times per day  Cough drops, alcohol free Flonase (by prescription only) Guaifenesin Mucinex Robitussin DM (plain only, alcohol free) Saline nasal spray/drops Sudafed (pseudoephedrine) & Actifed ** use only after [redacted] weeks gestation and if you do not have high blood pressure Tylenol Vicks Vaporub Zinc lozenges Zyrtec   Constipation: Colace Ducolax suppositories Fleet enema Glycerin suppositories Metamucil Milk of magnesia Miralax Senokot Smooth move tea  Diarrhea: Kaopectate Imodium A-D  *NO pepto Bismol  Hemorrhoids: Anusol Anusol HC Preparation H Tucks  Indigestion: Tums Maalox Mylanta Zantac  Pepcid  Insomnia: Benadryl (alcohol free) 25mg  every 6 hours as needed Tylenol PM Unisom, no Gelcaps  Leg Cramps: Tums MagGel  Nausea/Vomiting:  Bonine Dramamine Emetrol Ginger extract Sea bands Meclizine  Nausea medication to take during pregnancy:  Unisom (doxylamine succinate 25 mg tablets) Take one tablet daily at bedtime.  If symptoms are not adequately controlled, the dose can be increased to a maximum recommended dose of two tablets daily (1/2 tablet in the morning, 1/2 tablet mid-afternoon and one at bedtime). °Vitamin B6 100mg tablets. Take one tablet twice a day (up to 200 mg per day). ° °Skin Rashes: °Aveeno products °Benadryl cream or 25mg every 6 hours as needed °Calamine Lotion °1% cortisone cream ° °Yeast infection: °Gyne-lotrimin 7 °Monistat 7 ° ° °**If taking multiple medications, please check labels to avoid duplicating the same active ingredients °**take medication as directed on the label °** Do not exceed 4000 mg of tylenol in 24 hours °**Do not take medications that contain aspirin or ibuprofen ° ° ° ° ° °

## 2018-12-13 NOTE — Progress Notes (Signed)
History:   Olivia Park is a 33 y.o. [id number] at 71w2dby 8 week [place] seen today for her first obstetrical visit.  Her obstetrical history is significant for preterm delivery x 2, A1GDM and Gestational Hypertension. Patient does intend to breast feed. Pregnancy history fully reviewed.  Patient reports no complaints. She was seen in MAU for nausea and vomiting but endorses improvement with B6/Unisom, Robinul and Phenergan suppositories.     HISTORY: OB History  Gravida Para Term Preterm AB Living  3 2 0 2 0 2  SAB TAB Ectopic Multiple Live Births  0 0 0 0 2    # Outcome Date GA Lbr Len/2nd Weight Sex Delivery Anes PTL Lv  3 Current           2 Preterm 09/22/17 [id number] / 00:05 5 lb 12.2 oz (2.615 kg) M Vag-Spont None  LIV     Name: Baranski,BOY Meosha     Apgar1: 8  Apgar5: 9  1 Preterm 03/26/02   4 lb 12 oz (2.155 kg) M Vag-Spont None N LIV    Last pap smear was done 12/13/2017 and was normal  Past Medical History:  Diagnosis Date  . Gestational diabetes   . Pregnancy induced hypertension    Developed Pre-e  . Vaginal Pap smear, abnormal    Past Surgical History:  Procedure Laterality Date  . NO PAST SURGERIES     Family History  Problem Relation Age of Onset  . Hypertension Mother   . Diabetes Mother   . Heart disease Mother   . Hypertension Father    Social History   Tobacco Use  . Smoking status: Never Smoker  . Smokeless tobacco: Never Used  Substance Use Topics  . Alcohol use: No    Comment: socially  . Drug use: No   No Known Allergies Current Outpatient Medications on File Prior to Visit  Medication Sig Dispense Refill  . Doxylamine-Pyridoxine 10-10 MG TBEC Take 2 tabs at bedtime. If needed, add another tab in the morning. If needed, add another tab in the afternoon, up to 4 tabs/day. 60 tablet 1  . metroNIDAZOLE (FLAGYL) 500 MG tablet Take 1 tablet (500 mg total) by mouth 2 (two) times daily. 14 tablet 0  . Prenatal MV-Min-FA-Omega-3  (PRENATAL GUMMIES/DHA & FA PO) Take 2 tablets by mouth daily.    . Blood Pressure Monitoring (BLOOD PRESSURE KIT) DEVI 1 Device by Does not apply route as needed. ICD 10 Z34.90 1 Device 0  . glycopyrrolate (ROBINUL) 1 MG tablet Take 1 tablet (1 mg total) by mouth 3 (three) times daily. (Patient not taking: Reported on 12/13/2018) 90 tablet 1  . promethazine (PHENERGAN) 25 MG suppository Place 1 suppository (25 mg total) rectally every 6 (six) hours as needed for nausea or vomiting. 12 each 0  . promethazine (PHENERGAN) 25 MG tablet Take 1 tablet (25 mg total) by mouth every 6 (six) hours as needed for nausea or vomiting. (Patient not taking: Reported on 11/28/2018) 30 tablet 1   No current facility-administered medications on file prior to visit.     Review of Systems Pertinent items noted in HPI and remainder of comprehensive ROS otherwise negative. Physical Exam:   Vitals:   12/13/18 1127  BP: 137/86  Pulse: 87  Weight: 212 lb (96.2 kg)   Fetal Heart Rate (bpm): 157 by Doppler System: General: well-developed, well-nourished female in no acute distress   Breasts:  normal appearance, no masses or tenderness bilaterally  Skin: normal coloration and turgor, no rashes   Neurologic: oriented, normal, negative, normal mood   Extremities: normal strength, tone, and muscle mass, ROM of all joints is normal   HEENT PERRLA, extraocular movement intact and sclera clear, anicteric   Mouth/Teeth mucous membranes moist, pharynx normal without lesions and dental hygiene good   Neck supple and no masses   Cardiovascular: regular rate and rhythm   Respiratory:  no respiratory distress, normal breath sounds   Abdomen: soft, non-tender; bowel sounds normal; no masses,  no organomegaly    Assessment:    Pregnancy: [id number] Patient Active Problem List   Diagnosis Date Noted  . Short interval between pregnancies affecting pregnancy, antepartum 11/29/2018  . Supervision of high risk pregnancy,  antepartum 11/28/2018  . History of preterm delivery, currently pregnant 11/28/2018  . Postpartum care and examination 10/24/2017  . History of gestational diabetes 10/24/2017  . History of gestational hypertension 10/24/2017     Plan:    1. Supervision of other normal pregnancy, antepartum - Continue routine care - Obstetric Panel, Including HIV - Culture, OB Urine - GC/Chlamydia probe amp ([facility])not at [facility] - Genetic Screening - US MFM OB COMP + 14 WK; Future  2. History of preterm delivery, currently pregnant - For 2019 SVD, was admitted to [facility] for Preeclampsia, then found to have questionable SROM followed by [facility] - Reviewed data for 17-P. Did not receive during previous pregnancy. Requests this pregnancy  3. Nausea and vomiting during pregnancy prior to [redacted] weeks gestation - Improving with prescribed medications, declines alterations today  4. Short interval between pregnancies affecting pregnancy, antepartum - S/p SVD 09/22/2017  5. History of gestational diabetes  6. History of gestational hypertension - Baseline labs today - Advised daily ASA 81 mg now through 6 weeks postpartum per MFM guidelines - Hemoglobin A1c - Comprehensive metabolic panel - Protein / creatinine ratio, urine  Initial labs drawn. Continue prenatal vitamins. Genetic Screening discussed, First trimester screen, Quad screen and NIPS: ordered. Ultrasound discussed; fetal anatomic survey: ordered. Problem list reviewed and updated. The nature of [facility] [place] with multiple MDs and other Advanced Practice Providers was explained to patient; also emphasized that residents, students are part of our team. Routine obstetric precautions reviewed. Return in about 2 days (around 12/15/2018) for Early GTT this week.   Begin 17-P week 16-20 Virtual visit at 77 weeks   Mallie Snooks, MSN, CNM Certified Nurse Midwife, [employer] for [employer], [place] 12/13/18 1:14 PM

## 2018-12-14 LAB — COMPREHENSIVE METABOLIC PANEL
ALT: 9 IU/L (ref 0–32)
AST: 13 IU/L (ref 0–40)
Albumin/Globulin Ratio: 1.7 (ref 1.2–2.2)
Albumin: 4.4 g/dL (ref 3.8–4.8)
Alkaline Phosphatase: 81 IU/L (ref 39–117)
BUN/Creatinine Ratio: 10 (ref 9–23)
BUN: 7 mg/dL (ref 6–20)
Bilirubin Total: 0.2 mg/dL (ref 0.0–1.2)
CO2: 21 mmol/L (ref 20–29)
Calcium: 9.5 mg/dL (ref 8.7–10.2)
Chloride: 100 mmol/L (ref 96–106)
Creatinine, Ser: 0.69 mg/dL (ref 0.57–1.00)
GFR calc Af Amer: 133 mL/min/{1.73_m2} (ref >59)
GFR calc non Af Amer: 116 mL/min/{1.73_m2} (ref >59)
Globulin, Total: 2.6 g/dL (ref 1.5–4.5)
Glucose: 75 mg/dL (ref 65–99)
Potassium: 3.6 mmol/L (ref 3.5–5.2)
Sodium: 138 mmol/L (ref 134–144)
Total Protein: 7 g/dL (ref 6.0–8.5)

## 2018-12-14 LAB — OBSTETRIC PANEL, INCLUDING HIV
Antibody Screen: NEGATIVE
Basophils Absolute: 0 10*3/uL (ref 0.0–0.2)
Basos: 0 %
EOS (ABSOLUTE): 0.1 10*3/uL (ref 0.0–0.4)
Eos: 1 %
HIV Screen 4th Generation wRfx: NONREACTIVE
Hematocrit: 37.3 % (ref 34.0–46.6)
Hemoglobin: 13 g/dL (ref 11.1–15.9)
Hepatitis B Surface Ag: NEGATIVE
Immature Grans (Abs): 0 10*3/uL (ref 0.0–0.1)
Immature Granulocytes: 0 %
Lymphocytes Absolute: 1.9 10*3/uL (ref 0.7–3.1)
Lymphs: 19 %
MCH: 27.8 pg (ref 26.6–33.0)
MCHC: 34.9 g/dL (ref 31.5–35.7)
MCV: 80 fL (ref 79–97)
Monocytes Absolute: 0.6 10*3/uL (ref 0.1–0.9)
Monocytes: 5 %
Neutrophils Absolute: 7.6 10*3/uL — ABNORMAL HIGH (ref 1.4–7.0)
Neutrophils: 75 %
Platelets: 282 10*3/uL (ref 150–450)
RBC: 4.67 x10E6/uL (ref 3.77–5.28)
RDW: 13.6 % (ref 11.7–15.4)
RPR Ser Ql: NONREACTIVE
Rh Factor: POSITIVE
Rubella Antibodies, IGG: 1.6 index (ref >0.99)
WBC: 10.2 10*3/uL (ref 3.4–10.8)

## 2018-12-14 LAB — PROTEIN / CREATININE RATIO, URINE
Creatinine, Urine: 298.2 mg/dL
Protein, Ur: 37.7 mg/dL
Protein/Creat Ratio: 126 mg/g creat (ref 0–200)

## 2018-12-14 LAB — HEMOGLOBIN A1C
Est. average glucose Bld gHb Est-mCnc: 105 mg/dL
Hgb A1c MFr Bld: 5.3 % (ref 4.8–5.6)

## 2018-12-15 ENCOUNTER — Other Ambulatory Visit: Payer: Self-pay

## 2018-12-15 ENCOUNTER — Other Ambulatory Visit: Payer: Medicaid Other

## 2018-12-15 DIAGNOSIS — Z3480 Encounter for supervision of other normal pregnancy, unspecified trimester: Secondary | ICD-10-CM

## 2018-12-15 LAB — URINE CYTOLOGY ANCILLARY ONLY
Chlamydia: NEGATIVE
Neisseria Gonorrhea: NEGATIVE

## 2018-12-16 LAB — URINE CULTURE, OB REFLEX

## 2018-12-16 LAB — CULTURE, OB URINE

## 2018-12-16 LAB — GLUCOSE TOLERANCE, 1 HOUR: Glucose, 1Hr PP: 126 mg/dL (ref 65–199)

## 2018-12-18 ENCOUNTER — Other Ambulatory Visit: Payer: Self-pay | Admitting: Advanced Practice Midwife

## 2018-12-18 DIAGNOSIS — O2341 Unspecified infection of urinary tract in pregnancy, first trimester: Secondary | ICD-10-CM

## 2018-12-18 DIAGNOSIS — O2340 Unspecified infection of urinary tract in pregnancy, unspecified trimester: Secondary | ICD-10-CM | POA: Insufficient documentation

## 2018-12-18 MED ORDER — CEPHALEXIN 500 MG PO CAPS: 500.0000 mg | ORAL_CAPSULE | Freq: Four times a day (QID) | ORAL | 2 refills | Status: DC

## 2018-12-18 NOTE — Progress Notes (Signed)
>   100,000 colony units on urine culture at new ob. Rx to pharmacy. Patient notified via active MyChart account.  Mallie Snooks, MSN, CNM Certified Nurse Midwife, [facility] for [employer], [facility] 12/18/18 9:48 AM

## 2018-12-25 ENCOUNTER — Encounter: Payer: Self-pay | Admitting: Radiology

## 2018-12-25 ENCOUNTER — Encounter: Payer: Medicaid Other | Admitting: Obstetrics and Gynecology

## 2018-12-25 ENCOUNTER — Telehealth: Payer: Self-pay | Admitting: Radiology

## 2018-12-25 NOTE — Telephone Encounter (Signed)
Patient inform of Panorama results

## 2018-12-26 ENCOUNTER — Other Ambulatory Visit: Payer: Self-pay

## 2018-12-26 MED ORDER — TERCONAZOLE 0.4 % VA CREA: TOPICAL_CREAM | VAGINAL | 0 refills | Status: DC

## 2019-01-02 DIAGNOSIS — H40033 Anatomical narrow angle, bilateral: Secondary | ICD-10-CM | POA: Diagnosis not present

## 2019-01-02 DIAGNOSIS — G44209 Tension-type headache, unspecified, not intractable: Secondary | ICD-10-CM | POA: Diagnosis not present

## 2019-01-09 ENCOUNTER — Other Ambulatory Visit: Payer: Self-pay | Admitting: *Deleted

## 2019-01-10 ENCOUNTER — Other Ambulatory Visit: Payer: Self-pay

## 2019-01-10 ENCOUNTER — Ambulatory Visit (INDEPENDENT_AMBULATORY_CARE_PROVIDER_SITE_OTHER): Payer: Medicaid Other | Admitting: Obstetrics & Gynecology

## 2019-01-10 VITALS — BP 118/78 | HR 72

## 2019-01-10 DIAGNOSIS — Z3A16 16 weeks gestation of pregnancy: Secondary | ICD-10-CM | POA: Diagnosis not present

## 2019-01-10 DIAGNOSIS — O09212 Supervision of pregnancy with history of pre-term labor, second trimester: Secondary | ICD-10-CM

## 2019-01-10 DIAGNOSIS — Z3482 Encounter for supervision of other normal pregnancy, second trimester: Secondary | ICD-10-CM

## 2019-01-10 DIAGNOSIS — Z3480 Encounter for supervision of other normal pregnancy, unspecified trimester: Secondary | ICD-10-CM

## 2019-01-10 MED ORDER — HYDROXYPROGESTERONE CAPROATE 275 MG/1.1ML ~~LOC~~ SOAJ
275.0000 mg | Freq: Once | SUBCUTANEOUS | Status: DC
  Administered 2019-01-10: 275 mg via SUBCUTANEOUS

## 2019-01-10 NOTE — Progress Notes (Addendum)
Patient presented to the office today for 17-P injection received in patients right arm IM. 275 mg/1.50ml. Patient tolerated injection well and will follow up in one week for her second injection. Advised patient she may have some swelling and pain at the site for a couple of days.No side effects to note at this time. Patient verbalized understanding at this time.

## 2019-01-10 NOTE — Addendum Note (Signed)
Addended by: Phillip Heal, Mekhi Lascola A on: 01/10/2019 01:23 PM   Modules accepted: Orders

## 2019-01-17 ENCOUNTER — Other Ambulatory Visit: Payer: Self-pay

## 2019-01-17 ENCOUNTER — Ambulatory Visit (INDEPENDENT_AMBULATORY_CARE_PROVIDER_SITE_OTHER): Payer: Medicaid Other

## 2019-01-17 VITALS — BP 126/72 | HR 72

## 2019-01-17 DIAGNOSIS — O09212 Supervision of pregnancy with history of pre-term labor, second trimester: Secondary | ICD-10-CM

## 2019-01-17 DIAGNOSIS — Z3480 Encounter for supervision of other normal pregnancy, unspecified trimester: Secondary | ICD-10-CM

## 2019-01-17 MED ORDER — HYDROXYPROGESTERONE CAPROATE 275 MG/1.1ML ~~LOC~~ SOAJ
275.0000 mg | Freq: Once | SUBCUTANEOUS | Status: AC
  Administered 2019-01-17: 14:00:00 275 mg via SUBCUTANEOUS

## 2019-01-17 NOTE — Progress Notes (Signed)
Patient presented to the office for her weekly 17-P injection. 275 mg received in right IM. NDC# [id number]. Patient will follow up in one week for her next injection.

## 2019-01-24 ENCOUNTER — Ambulatory Visit (INDEPENDENT_AMBULATORY_CARE_PROVIDER_SITE_OTHER): Payer: Medicaid Other | Admitting: *Deleted

## 2019-01-24 ENCOUNTER — Other Ambulatory Visit: Payer: Self-pay

## 2019-01-24 VITALS — BP 127/82 | HR 85

## 2019-01-24 DIAGNOSIS — Z3A18 18 weeks gestation of pregnancy: Secondary | ICD-10-CM

## 2019-01-24 DIAGNOSIS — O09212 Supervision of pregnancy with history of pre-term labor, second trimester: Secondary | ICD-10-CM | POA: Diagnosis not present

## 2019-01-24 DIAGNOSIS — O0990 Supervision of high risk pregnancy, unspecified, unspecified trimester: Secondary | ICD-10-CM

## 2019-01-24 MED ORDER — HYDROXYPROGESTERONE CAPROATE 275 MG/1.1ML ~~LOC~~ SOAJ
275.0000 mg | Freq: Once | SUBCUTANEOUS | Status: AC
  Administered 2019-01-24: 275 mg via SUBCUTANEOUS

## 2019-01-24 NOTE — Progress Notes (Signed)
Pt here for 17p. Pt denies any major issues at time, she did have a single vomiting spell yesterday but is better today and is able to hold down food and liquids.  Pt tolerated injection well. Pt requested to hear FHR as well today.  FHR 155.

## 2019-01-24 NOTE — Progress Notes (Signed)
ATTESTATION OF SUPERVISION OF RN: Evaluation and management procedures were performed by the RN under my supervision and collaboration. I have reviewed the nursing note and chart and agree with the management and plan for this patient.  Samantha Weinhold, CNM  

## 2019-01-29 ENCOUNTER — Other Ambulatory Visit: Payer: Self-pay | Admitting: Advanced Practice Midwife

## 2019-01-29 ENCOUNTER — Ambulatory Visit (HOSPITAL_COMMUNITY)
Admission: RE | Admit: 2019-01-29 | Discharge: 2019-01-29 | Disposition: A | Discharge status: Home / Self Care | Payer: Medicaid Other | Source: Ambulatory Visit | Attending: Advanced Practice Midwife | Admitting: Advanced Practice Midwife

## 2019-01-29 ENCOUNTER — Other Ambulatory Visit: Payer: Self-pay

## 2019-01-29 DIAGNOSIS — Z3A19 19 weeks gestation of pregnancy: Secondary | ICD-10-CM

## 2019-01-29 DIAGNOSIS — Z3480 Encounter for supervision of other normal pregnancy, unspecified trimester: Secondary | ICD-10-CM | POA: Insufficient documentation

## 2019-01-29 DIAGNOSIS — O99212 Obesity complicating pregnancy, second trimester: Secondary | ICD-10-CM | POA: Diagnosis not present

## 2019-01-29 DIAGNOSIS — O09292 Supervision of pregnancy with other poor reproductive or obstetric history, second trimester: Secondary | ICD-10-CM

## 2019-01-30 ENCOUNTER — Other Ambulatory Visit (HOSPITAL_COMMUNITY): Payer: Self-pay | Admitting: *Deleted

## 2019-01-30 DIAGNOSIS — Z362 Encounter for other antenatal screening follow-up: Secondary | ICD-10-CM

## 2019-01-31 ENCOUNTER — Ambulatory Visit (INDEPENDENT_AMBULATORY_CARE_PROVIDER_SITE_OTHER): Payer: Medicaid Other | Admitting: *Deleted

## 2019-01-31 ENCOUNTER — Other Ambulatory Visit: Payer: Self-pay

## 2019-01-31 VITALS — BP 117/81 | HR 78

## 2019-01-31 DIAGNOSIS — O09212 Supervision of pregnancy with history of pre-term labor, second trimester: Secondary | ICD-10-CM | POA: Diagnosis not present

## 2019-01-31 DIAGNOSIS — Z3A19 19 weeks gestation of pregnancy: Secondary | ICD-10-CM | POA: Diagnosis not present

## 2019-01-31 DIAGNOSIS — O0990 Supervision of high risk pregnancy, unspecified, unspecified trimester: Secondary | ICD-10-CM

## 2019-01-31 MED ORDER — HYDROXYPROGESTERONE CAPROATE 275 MG/1.1ML ~~LOC~~ SOAJ
275.0000 mg | Freq: Once | SUBCUTANEOUS | Status: AC
  Administered 2019-01-31: 275 mg via SUBCUTANEOUS

## 2019-01-31 NOTE — Progress Notes (Signed)
Pt here for 17p, denies any issues at this time. Pt tolerated injection well. Pt to follow up next week.

## 2019-01-31 NOTE — Progress Notes (Signed)
Attestation of Attending Supervision of clinical support staff: I agree with the care provided to this patient and was available for any consultation.  I have reviewed the RN's note and chart. I was available for consult and to see the patient if needed.   Fate Caster Niles Davarion Cuffee, MD, MPH, ABFM Attending Physician Faculty Practice- [facility]  

## 2019-02-07 ENCOUNTER — Other Ambulatory Visit: Payer: Self-pay

## 2019-02-07 ENCOUNTER — Ambulatory Visit (INDEPENDENT_AMBULATORY_CARE_PROVIDER_SITE_OTHER): Payer: Medicaid Other | Admitting: Advanced Practice Midwife

## 2019-02-07 VITALS — BP 116/77 | HR 72 | Wt 226.8 lb

## 2019-02-07 DIAGNOSIS — O09892 Supervision of other high risk pregnancies, second trimester: Secondary | ICD-10-CM

## 2019-02-07 DIAGNOSIS — Z3A20 20 weeks gestation of pregnancy: Secondary | ICD-10-CM

## 2019-02-07 DIAGNOSIS — Z8759 Personal history of other complications of pregnancy, childbirth and the puerperium: Secondary | ICD-10-CM

## 2019-02-07 DIAGNOSIS — O99210 Obesity complicating pregnancy, unspecified trimester: Secondary | ICD-10-CM

## 2019-02-07 DIAGNOSIS — O09899 Supervision of other high risk pregnancies, unspecified trimester: Secondary | ICD-10-CM

## 2019-02-07 DIAGNOSIS — O0992 Supervision of high risk pregnancy, unspecified, second trimester: Secondary | ICD-10-CM

## 2019-02-07 DIAGNOSIS — O99212 Obesity complicating pregnancy, second trimester: Secondary | ICD-10-CM

## 2019-02-07 DIAGNOSIS — O0990 Supervision of high risk pregnancy, unspecified, unspecified trimester: Secondary | ICD-10-CM

## 2019-02-07 MED ORDER — BLOOD PRESSURE MONITOR DEVI: 1 refills | Status: DC

## 2019-02-07 MED ORDER — HYDROXYPROGESTERONE CAPROATE 275 MG/1.1ML ~~LOC~~ SOAJ
275.0000 mg | Freq: Once | SUBCUTANEOUS | Status: AC
  Administered 2019-02-07: 275 mg via SUBCUTANEOUS

## 2019-02-07 NOTE — Progress Notes (Signed)
   PRENATAL VISIT NOTE  Subjective:  Olivia Park is a 33 y.o. G3P0202 at [redacted]w[redacted]d being seen today for ongoing prenatal care.  She is currently monitored for the following issues for this high-risk pregnancy and has Postpartum care and examination; History of gestational diabetes; History of gestational hypertension; Supervision of high risk pregnancy, antepartum; History of preterm delivery, currently pregnant; Short interval between pregnancies affecting pregnancy, antepartum; and UTI (urinary tract infection) during pregnancy on their problem list.  Patient reports no complaints.  Contractions: Not present.  .  Movement: Present. Denies leaking of fluid.   The following portions of the patient's history were reviewed and updated as appropriate: allergies, current medications, past family history, past medical history, past social history, past surgical history and problem list. Problem list updated.  Objective:   Vitals:   02/07/19 1324  BP: 116/77  Pulse: 72  Weight: 226 lb 12.8 oz (102.9 kg)    Fetal Status: Fetal Heart Rate (bpm): 146   Movement: Present     General:  Alert, oriented and cooperative. Patient is in no acute distress.  Skin: Skin is warm and dry. No rash noted.   Cardiovascular: Normal heart rate noted  Respiratory: Normal respiratory effort, no problems with respiration noted  Abdomen: Soft, gravid, appropriate for gestational age.  Pain/Pressure: Present     Pelvic: Cervical exam deferred        Extremities: Normal range of motion.     Mental Status: Normal mood and affect. Normal behavior. Normal judgment and thought content.   Assessment and Plan:  Pregnancy: [id number] at [redacted]w[redacted]d  1. Supervision of high risk pregnancy, antepartum - Continue routine care - MFM 02/27/19  2. History of gestational hypertension - Normotensive. Has not received BP cuff but is picking up today  3. History of preterm delivery, currently pregnant - Weekly 17-P  4. Obesity in  pregnancy - 14 lb weight gain from previous visit. Emphasize healthy snacks and reducing number of cheat meals each week  Preterm labor symptoms and general obstetric precautions including but not limited to vaginal bleeding, contractions, leaking of fluid and fetal movement were reviewed in detail with the patient. Please refer to After Visit Summary for other counseling recommendations.  Return in about 8 weeks (around 04/04/2019) for 28 week in person for 2 hour GTT and labs.  Future Appointments  Date Time Provider [place]  02/14/2019  1:30 PM [facility] NURSE [facility]  02/21/2019  1:15 PM [facility] NURSE [facility]  02/27/2019  1:45 PM [place] NURSE [place] [facility]  02/27/2019  1:45 PM [place] 2 [facility]  02/28/2019  1:15 PM [facility] NURSE [facility]  04/04/2019  8:15 AM Darlina Rumpf, CNM [facility]    Darlina Rumpf, CNM

## 2019-02-07 NOTE — Patient Instructions (Signed)

## 2019-02-14 ENCOUNTER — Other Ambulatory Visit: Payer: Self-pay

## 2019-02-14 ENCOUNTER — Ambulatory Visit (INDEPENDENT_AMBULATORY_CARE_PROVIDER_SITE_OTHER): Payer: Medicaid Other

## 2019-02-14 VITALS — BP 107/78 | HR 72

## 2019-02-14 DIAGNOSIS — O09212 Supervision of pregnancy with history of pre-term labor, second trimester: Secondary | ICD-10-CM

## 2019-02-14 DIAGNOSIS — Z3A21 21 weeks gestation of pregnancy: Secondary | ICD-10-CM | POA: Diagnosis not present

## 2019-02-14 DIAGNOSIS — O0990 Supervision of high risk pregnancy, unspecified, unspecified trimester: Secondary | ICD-10-CM

## 2019-02-14 MED ORDER — HYDROXYPROGESTERONE CAPROATE 275 MG/1.1ML ~~LOC~~ SOAJ
275.0000 mg | Freq: Once | SUBCUTANEOUS | Status: AC
  Administered 2019-02-14: 275 mg via SUBCUTANEOUS

## 2019-02-14 NOTE — Progress Notes (Signed)
Patient seen and assessed by nursing staff during this encounter. I have reviewed the chart and agree with the documentation and plan.  Verita Schneiders, MD 02/14/2019 2:39 PM

## 2019-02-14 NOTE — Progress Notes (Signed)
Patient presented to the office for her 17-p injection  Given by: D.Ladon Heney right arm sq Dose given 275 mg/1.1 ml  No side effects to note at this time Patient will follow next week for her second injection.

## 2019-02-20 DIAGNOSIS — H1013 Acute atopic conjunctivitis, bilateral: Secondary | ICD-10-CM | POA: Diagnosis not present

## 2019-02-21 ENCOUNTER — Encounter: Payer: Self-pay | Admitting: Advanced Practice Midwife

## 2019-02-21 ENCOUNTER — Ambulatory Visit (INDEPENDENT_AMBULATORY_CARE_PROVIDER_SITE_OTHER): Payer: Medicaid Other | Admitting: Advanced Practice Midwife

## 2019-02-21 ENCOUNTER — Other Ambulatory Visit: Payer: Self-pay

## 2019-02-21 VITALS — BP 125/80 | HR 86

## 2019-02-21 DIAGNOSIS — Z3A22 22 weeks gestation of pregnancy: Secondary | ICD-10-CM | POA: Diagnosis not present

## 2019-02-21 DIAGNOSIS — O0990 Supervision of high risk pregnancy, unspecified, unspecified trimester: Secondary | ICD-10-CM

## 2019-02-21 DIAGNOSIS — O09899 Supervision of other high risk pregnancies, unspecified trimester: Secondary | ICD-10-CM

## 2019-02-21 DIAGNOSIS — O09892 Supervision of other high risk pregnancies, second trimester: Secondary | ICD-10-CM

## 2019-02-21 DIAGNOSIS — O09212 Supervision of pregnancy with history of pre-term labor, second trimester: Secondary | ICD-10-CM

## 2019-02-21 DIAGNOSIS — R1030 Lower abdominal pain, unspecified: Secondary | ICD-10-CM

## 2019-02-21 DIAGNOSIS — O0992 Supervision of high risk pregnancy, unspecified, second trimester: Secondary | ICD-10-CM

## 2019-02-21 DIAGNOSIS — O99891 Other specified diseases and conditions complicating pregnancy: Secondary | ICD-10-CM

## 2019-02-21 MED ORDER — HYDROXYPROGESTERONE CAPROATE 275 MG/1.1ML ~~LOC~~ SOAJ
275.0000 mg | Freq: Once | SUBCUTANEOUS | Status: AC
  Administered 2019-02-21: 275 mg via SUBCUTANEOUS

## 2019-02-21 MED ORDER — CYCLOBENZAPRINE HCL 10 MG PO TABS: 10.0000 mg | ORAL_TABLET | Freq: Three times a day (TID) | ORAL | 0 refills | Status: DC | PRN

## 2019-02-21 MED ORDER — ACETAMINOPHEN 500 MG PO TABS
1000.0000 mg | ORAL_TABLET | Freq: Once | ORAL | Status: AC
  Administered 2019-02-21: 1000 mg via ORAL

## 2019-02-21 NOTE — Progress Notes (Signed)
Pt has been cramping since yesterday and is very uncomfortable

## 2019-02-21 NOTE — Patient Instructions (Signed)

## 2019-02-21 NOTE — Progress Notes (Signed)
PRENATAL VISIT NOTE  Subjective:  Olivia Park is a 33 y.o. [phone] at [redacted]w[redacted]d being seen today for ongoing prenatal care.  She is currently monitored for the following issues for this high-risk pregnancy and has Postpartum care and examination; History of gestational diabetes; History of gestational hypertension; Supervision of high risk pregnancy, antepartum; History of preterm delivery, currently pregnant; Short interval between pregnancies affecting pregnancy, antepartum; and UTI (urinary tract infection) during pregnancy on their problem list.  Patient reports painful recurrent abdominal "cramping", new onset last night. 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 States she felt cramping 7/10 which did not resolve with rest and hydration. She reported for her shift early this morning, worked her full shift until noon and now presents with chief complaint of strong abdominal cramps. Her pain is 7/10 at rest and 10/10 when she walks. Her pain does not radiate. She has not taken medication or tried other treatments for this complaint. She Denies vaginal bleeding, leaking of fluid, decreased fetal movement, fever, falls, or recent illness.  The following portions of the patient's history were reviewed and updated as appropriate: allergies, current medications, past family history, past medical history, past social history, past surgical history and problem list. Problem list updated.  Objective:   Vitals:   02/21/19 1332  BP: 125/80  Pulse: 86    Fetal Status: Fetal Heart Rate (bpm): 147   Movement: Present     General:  Alert, oriented and cooperative. Patient is in no acute distress.  Skin: Skin is warm and dry. No rash noted.   Cardiovascular: Normal heart rate noted  Respiratory: Normal respiratory effort, no problems with respiration noted  Abdomen: Soft, gravid, appropriate for gestational age.  Pain/Pressure: Present     Pelvic: Cervical exam performed       closed/thick/posterior  Extremities: Normal range of motion.     Mental Status: Normal mood and affect. Normal behavior. Normal judgment and thought content.   Assessment and Plan:  Pregnancy: [id number] at [redacted]w[redacted]d  1. Supervision of high risk pregnancy, antepartum - Presented for 17-P injection. Changed to Provider visit when [facility] saw patient struggling to walk  2. History of preterm delivery, currently pregnant - Continue weekly 17-P  3. Pregnancy related bilateral lower abdominal cramping, antepartum - FFN collected prior to cervical exam. Cervix closed/thick/posterior. Abdomen remains soft during episodes of cramping. FFN discarded --Discussed possibility of round ligament and pubic symphysis pain after long shift --Patient advised to present to MAU for evaluation. Given soft abdomen, closed cervix, and recent shift on her feet at work, patient given option of brief home management with Tylenol, Flexeril and PO hydration.  --Patient encouraged to report to MAU if she does not feel significantly better by 4pm. Report called to MAU Providers  Preterm labor symptoms and general obstetric precautions including but not limited to vaginal bleeding, contractions, leaking of fluid and fetal movement were reviewed in detail with the patient. Please refer to After Visit Summary for other counseling recommendations.    Future Appointments  Date Time Provider [place]  02/27/2019  1:45 PM [place] [facility]  02/27/2019  1:45 PM [facility] [place] 2 [facility]  02/28/2019  1:15 PM [facility] NURSE [facility]  03/07/2019  1:15 PM [facility] NURSE [facility]  03/14/2019  1:15 PM [facility] NURSE [facility]  03/21/2019  1:15 PM [facility] NURSE [facility]  03/28/2019  1:30 PM [facility] NURSE [facility]  04/04/2019  8:15 AM Darlina Rumpf, CNM [facility]  [facility]    Clayton Bibles, MSN, CNM Certified Nurse Midwife, [employer] for [employer], [facility] 02/21/19 3:23 PM

## 2019-02-24 DIAGNOSIS — H5213 Myopia, bilateral: Secondary | ICD-10-CM | POA: Diagnosis not present

## 2019-02-27 ENCOUNTER — Other Ambulatory Visit (HOSPITAL_COMMUNITY): Payer: Self-pay | Admitting: *Deleted

## 2019-02-27 ENCOUNTER — Ambulatory Visit (HOSPITAL_COMMUNITY)
Admission: RE | Admit: 2019-02-27 | Discharge: 2019-02-27 | Disposition: A | Discharge status: Home / Self Care | Payer: Medicaid Other | Source: Ambulatory Visit | Attending: Obstetrics and Gynecology | Admitting: Obstetrics and Gynecology

## 2019-02-27 ENCOUNTER — Other Ambulatory Visit: Payer: Self-pay

## 2019-02-27 ENCOUNTER — Ambulatory Visit (HOSPITAL_COMMUNITY): Payer: Medicaid Other | Admitting: *Deleted

## 2019-02-27 ENCOUNTER — Encounter (HOSPITAL_COMMUNITY): Payer: Self-pay | Admitting: *Deleted

## 2019-02-27 DIAGNOSIS — O99212 Obesity complicating pregnancy, second trimester: Secondary | ICD-10-CM | POA: Diagnosis not present

## 2019-02-27 DIAGNOSIS — Z362 Encounter for other antenatal screening follow-up: Secondary | ICD-10-CM | POA: Diagnosis not present

## 2019-02-27 DIAGNOSIS — O09292 Supervision of pregnancy with other poor reproductive or obstetric history, second trimester: Secondary | ICD-10-CM

## 2019-02-27 DIAGNOSIS — O09899 Supervision of other high risk pregnancies, unspecified trimester: Secondary | ICD-10-CM | POA: Diagnosis not present

## 2019-02-27 DIAGNOSIS — O0990 Supervision of high risk pregnancy, unspecified, unspecified trimester: Secondary | ICD-10-CM | POA: Diagnosis not present

## 2019-02-27 DIAGNOSIS — O09212 Supervision of pregnancy with history of pre-term labor, second trimester: Secondary | ICD-10-CM

## 2019-02-27 DIAGNOSIS — Z6841 Body Mass Index (BMI) 40.0 and over, adult: Secondary | ICD-10-CM

## 2019-02-27 DIAGNOSIS — Z3A23 23 weeks gestation of pregnancy: Secondary | ICD-10-CM | POA: Diagnosis not present

## 2019-02-28 ENCOUNTER — Ambulatory Visit (INDEPENDENT_AMBULATORY_CARE_PROVIDER_SITE_OTHER): Payer: Medicaid Other

## 2019-02-28 VITALS — BP 112/75 | HR 72

## 2019-02-28 DIAGNOSIS — Z3A23 23 weeks gestation of pregnancy: Secondary | ICD-10-CM | POA: Diagnosis not present

## 2019-02-28 DIAGNOSIS — O0990 Supervision of high risk pregnancy, unspecified, unspecified trimester: Secondary | ICD-10-CM

## 2019-02-28 DIAGNOSIS — O09212 Supervision of pregnancy with history of pre-term labor, second trimester: Secondary | ICD-10-CM

## 2019-02-28 MED ORDER — HYDROXYPROGESTERONE CAPROATE 275 MG/1.1ML ~~LOC~~ SOAJ
275.0000 mg | Freq: Once | SUBCUTANEOUS | Status: AC
  Administered 2019-02-28: 275 mg via SUBCUTANEOUS

## 2019-02-28 NOTE — Progress Notes (Signed)
Patient presented to the office today for her 17-P injection given in the right arm sq. Patient tolerated well and will follow up in 1 week for her next injection.   Dose give  275 mg/ml By:D.Graham

## 2019-03-07 ENCOUNTER — Ambulatory Visit (INDEPENDENT_AMBULATORY_CARE_PROVIDER_SITE_OTHER): Payer: Medicaid Other | Admitting: *Deleted

## 2019-03-07 ENCOUNTER — Other Ambulatory Visit: Payer: Self-pay

## 2019-03-07 VITALS — BP 120/77 | HR 91

## 2019-03-07 DIAGNOSIS — O09212 Supervision of pregnancy with history of pre-term labor, second trimester: Secondary | ICD-10-CM

## 2019-03-07 DIAGNOSIS — Z3A24 24 weeks gestation of pregnancy: Secondary | ICD-10-CM

## 2019-03-07 DIAGNOSIS — O0990 Supervision of high risk pregnancy, unspecified, unspecified trimester: Secondary | ICD-10-CM

## 2019-03-07 MED ORDER — HYDROXYPROGESTERONE CAPROATE 275 MG/1.1ML ~~LOC~~ SOAJ
275.0000 mg | Freq: Once | SUBCUTANEOUS | Status: AC
  Administered 2019-03-07: 275 mg via SUBCUTANEOUS

## 2019-03-07 NOTE — Progress Notes (Signed)
Pt here today for her 17p injection. Pt denies and issues at this time. Pt tolerated injection well. To follow up next week.

## 2019-03-07 NOTE — Progress Notes (Signed)
ATTESTATION OF SUPERVISION OF RN: Evaluation and management procedures were performed by the RN under my supervision and collaboration. I have reviewed the nursing note and chart and agree with the management and plan for this patient.  Samantha Weinhold, CNM  

## 2019-03-14 ENCOUNTER — Ambulatory Visit: Payer: Medicaid Other

## 2019-03-15 ENCOUNTER — Ambulatory Visit (INDEPENDENT_AMBULATORY_CARE_PROVIDER_SITE_OTHER): Payer: Medicaid Other | Admitting: *Deleted

## 2019-03-15 ENCOUNTER — Other Ambulatory Visit: Payer: Self-pay

## 2019-03-15 VITALS — BP 117/76 | HR 81

## 2019-03-15 DIAGNOSIS — Z3A25 25 weeks gestation of pregnancy: Secondary | ICD-10-CM

## 2019-03-15 DIAGNOSIS — O09212 Supervision of pregnancy with history of pre-term labor, second trimester: Secondary | ICD-10-CM | POA: Diagnosis not present

## 2019-03-15 DIAGNOSIS — O0990 Supervision of high risk pregnancy, unspecified, unspecified trimester: Secondary | ICD-10-CM

## 2019-03-15 MED ORDER — HYDROXYPROGESTERONE CAPROATE 275 MG/1.1ML ~~LOC~~ SOAJ
275.0000 mg | Freq: Once | SUBCUTANEOUS | Status: AC
  Administered 2019-03-15: 275 mg via SUBCUTANEOUS

## 2019-03-15 NOTE — Progress Notes (Signed)
Pt here for 17p today. Denies any issues at this time. Pt tolerated injection well. Will follow up next week.

## 2019-03-16 NOTE — Progress Notes (Signed)
Patient seen and assessed by nursing staff during this encounter. I have reviewed the chart and agree with the documentation and plan.  Verita Schneiders, MD 03/16/2019 8:26 AM

## 2019-03-21 ENCOUNTER — Ambulatory Visit (INDEPENDENT_AMBULATORY_CARE_PROVIDER_SITE_OTHER): Payer: Medicaid Other | Admitting: *Deleted

## 2019-03-21 ENCOUNTER — Other Ambulatory Visit: Payer: Self-pay

## 2019-03-21 VITALS — BP 120/72 | HR 105

## 2019-03-21 DIAGNOSIS — O0990 Supervision of high risk pregnancy, unspecified, unspecified trimester: Secondary | ICD-10-CM

## 2019-03-21 DIAGNOSIS — O09212 Supervision of pregnancy with history of pre-term labor, second trimester: Secondary | ICD-10-CM

## 2019-03-21 DIAGNOSIS — Z3A27 27 weeks gestation of pregnancy: Secondary | ICD-10-CM

## 2019-03-21 MED ORDER — HYDROXYPROGESTERONE CAPROATE 275 MG/1.1ML ~~LOC~~ SOAJ
275.0000 mg | Freq: Once | SUBCUTANEOUS | Status: AC
  Administered 2019-03-21: 275 mg via SUBCUTANEOUS

## 2019-03-21 NOTE — Progress Notes (Signed)
Patient seen and assessed by nursing staff during this encounter. I have reviewed the chart and agree with the documentation and plan.  Lajean Manes, CNM 03/21/2019 2:23 PM

## 2019-03-21 NOTE — Progress Notes (Signed)
Pt here for 17p injection. Denies any issues at this time. Pt tolerated injection well. Will follow up next week.

## 2019-03-28 ENCOUNTER — Ambulatory Visit (INDEPENDENT_AMBULATORY_CARE_PROVIDER_SITE_OTHER): Payer: Medicaid Other | Admitting: *Deleted

## 2019-03-28 ENCOUNTER — Other Ambulatory Visit: Payer: Self-pay

## 2019-03-28 VITALS — BP 116/76 | HR 89

## 2019-03-28 DIAGNOSIS — O0990 Supervision of high risk pregnancy, unspecified, unspecified trimester: Secondary | ICD-10-CM

## 2019-03-28 DIAGNOSIS — Z3A27 27 weeks gestation of pregnancy: Secondary | ICD-10-CM

## 2019-03-28 DIAGNOSIS — O09212 Supervision of pregnancy with history of pre-term labor, second trimester: Secondary | ICD-10-CM | POA: Diagnosis not present

## 2019-03-28 MED ORDER — HYDROXYPROGESTERONE CAPROATE 275 MG/1.1ML ~~LOC~~ SOAJ
275.0000 mg | Freq: Once | SUBCUTANEOUS | Status: AC
  Administered 2019-03-28: 275 mg via SUBCUTANEOUS

## 2019-03-28 NOTE — Progress Notes (Signed)
Pt here today for her 17p. Pt denies any issues at this time. Pt tolerated injection well. Will follow up next week.

## 2019-03-30 NOTE — L&D Delivery Note (Signed)
[facility]N [facility] Delivery Note  Olivia Park is a 34 y.o. [id number] s/p NSVD at [redacted]w[redacted]d. She was admitted for IOL for gHTN and GDMA1.   SROM: 1h 60m with clear fluid GBS Status:  --/Positive (02/25 1149) Maximum Maternal Temperature: 99 F  Labor Progress: . Patient presented to L&D for IOL for gHTN and GDMA1. Initial SVE: 4cm. Labor course was uncomplicated. She then progressed to complete.   Delivery Date/Time:06/05/2019 @ 2152 Delivery: Called to room and patient was complete and pushing. Head position was LOA and delivered with ease over the perineum. Nuchal cord present and reduced easily after delivery. Shoulder and body delivered in usual fashion. Infant placed on mother's abdomen, dried and stimulated. Cord clamped and cut by Joellyn Haff CNM due to infant not spontaneously crying x 2. Cord blood drawn. Placenta delivered spontaneously with gentle cord traction. Fundus firm with massage and pitocin started. Labia, perineum, vagina, and cervix inspected and significant for no lacerations.  Baby Weight: 3410g  Cord: central insertion, 3 vessel Placenta: Sent to L&D Complications: None Lacerations: None EBL: 50cc Analgesia: None   Infant: APGAR (1 MIN):  6 APGAR (5 MINS): 9   Aldean Jewett MD, PGY-1 OBGYN Faculty Teaching Service  06/05/2019, 10:06 PM

## 2019-04-04 ENCOUNTER — Ambulatory Visit (INDEPENDENT_AMBULATORY_CARE_PROVIDER_SITE_OTHER): Payer: Medicaid Other | Admitting: Advanced Practice Midwife

## 2019-04-04 ENCOUNTER — Other Ambulatory Visit: Payer: Self-pay

## 2019-04-04 ENCOUNTER — Other Ambulatory Visit (HOSPITAL_COMMUNITY)
Admission: RE | Admit: 2019-04-04 | Discharge: 2019-04-04 | Disposition: A | Discharge status: Home / Self Care | Payer: Medicaid Other | Source: Ambulatory Visit | Attending: Advanced Practice Midwife | Admitting: Advanced Practice Midwife

## 2019-04-04 VITALS — BP 129/79 | HR 72 | Wt 240.0 lb

## 2019-04-04 DIAGNOSIS — Z3A28 28 weeks gestation of pregnancy: Secondary | ICD-10-CM

## 2019-04-04 DIAGNOSIS — O26893 Other specified pregnancy related conditions, third trimester: Secondary | ICD-10-CM

## 2019-04-04 DIAGNOSIS — O09893 Supervision of other high risk pregnancies, third trimester: Secondary | ICD-10-CM

## 2019-04-04 DIAGNOSIS — N898 Other specified noninflammatory disorders of vagina: Secondary | ICD-10-CM | POA: Insufficient documentation

## 2019-04-04 DIAGNOSIS — O99213 Obesity complicating pregnancy, third trimester: Secondary | ICD-10-CM

## 2019-04-04 DIAGNOSIS — Z8759 Personal history of other complications of pregnancy, childbirth and the puerperium: Secondary | ICD-10-CM

## 2019-04-04 DIAGNOSIS — O0990 Supervision of high risk pregnancy, unspecified, unspecified trimester: Secondary | ICD-10-CM | POA: Diagnosis not present

## 2019-04-04 DIAGNOSIS — O09899 Supervision of other high risk pregnancies, unspecified trimester: Secondary | ICD-10-CM

## 2019-04-04 DIAGNOSIS — O0993 Supervision of high risk pregnancy, unspecified, third trimester: Secondary | ICD-10-CM

## 2019-04-04 DIAGNOSIS — O99210 Obesity complicating pregnancy, unspecified trimester: Secondary | ICD-10-CM

## 2019-04-04 MED ORDER — HYDROXYPROGESTERONE CAPROATE 275 MG/1.1ML ~~LOC~~ SOAJ
275.0000 mg | Freq: Once | SUBCUTANEOUS | Status: AC
  Administered 2019-04-04: 275 mg via SUBCUTANEOUS

## 2019-04-04 NOTE — Patient Instructions (Signed)

## 2019-04-04 NOTE — Progress Notes (Signed)
   PRENATAL VISIT NOTE  Subjective:  Olivia Park is a 34 y.o. [phone] at [redacted]w[redacted]d being seen today for ongoing prenatal care.  She is currently monitored for the following issues for this high-risk pregnancy and has Postpartum care and examination; History of gestational diabetes; History of gestational hypertension; Supervision of high risk pregnancy, antepartum; History of preterm delivery, currently pregnant; Short interval between pregnancies affecting pregnancy, antepartum; and UTI (urinary tract infection) during pregnancy on their problem list.  Patient reports no complaints. She reports new onset abnormal vaginal discharge but endorses feeling "so much better" than in previous weeks.  Contractions: Not present. Movement: Present. Denies leaking of fluid.   The following portions of the patient's history were reviewed and updated as appropriate: allergies, current medications, past family history, past medical history, past social history, past surgical history and problem list. Problem list updated.  Objective:   Vitals:   04/04/19 0821  BP: 129/79  Pulse: 72  Weight: 240 lb (108.9 kg)    Fetal Status: Fetal Heart Rate (bpm): 145 Fundal Height: 27 cm Movement: Present     General:  Alert, oriented and cooperative. Patient is in no acute distress.  Skin: Skin is warm and dry. No rash noted.   Cardiovascular: Normal heart rate noted  Respiratory: Normal respiratory effort, no problems with respiration noted  Abdomen: Soft, gravid, appropriate for gestational age.  Pain/Pressure: Absent     Pelvic: Cervical exam deferred        Extremities: Normal range of motion.     Mental Status: Normal mood and affect. Normal behavior. Normal judgment and thought content.   Assessment and Plan:  Pregnancy: [id number] at [redacted]w[redacted]d  1. Supervision of high risk pregnancy, antepartum - No concerning findings, continue routine care - 2 Hour GTT - RPR - HIV antibody (with reflex) - CBC  2.  History of preterm delivery, currently pregnant --Well controlled, normotensive --Continue 17-P  3. History of gestational hypertension --Continue daily ASA 81mg  daily  4. Vaginal discharge during pregnancy in third trimester - Cervicovaginal ancillary only( [place])  5. Obesity in pregnancy - TWG 28 lbs, FH appropriate - Continue MFM surveillance, next visit 04/24/2019  Preterm labor symptoms and general obstetric precautions including but not limited to vaginal bleeding, contractions, leaking of fluid and fetal movement were reviewed in detail with the patient. Please refer to After Visit Summary for other counseling recommendations.  Return in about 4 weeks (around 05/02/2019) for Virtual.  Future Appointments  Date Time Provider Department Center  04/11/2019  1:15 PM [facility] NURSE [facility]  04/18/2019 11:15 AM 04/20/2019, CNM [facility]  04/24/2019  1:15 PM [facility] 04/26/2019 4 [facility]  04/24/2019  1:20 PM [facility] NURSE [facility]  04/25/2019  1:15 PM [facility] NURSE [facility]  05/02/2019  1:45 PM 06/30/2019, CNM [facility]  05/09/2019  1:15 PM [facility] NURSE [facility]  05/16/2019  1:15 PM 05/18/2019, CNM [facility]  05/23/2019  1:15 PM [facility] NURSE [facility]   05/25/2019, CNM

## 2019-04-04 NOTE — Progress Notes (Signed)
I was available as needed.

## 2019-04-05 ENCOUNTER — Other Ambulatory Visit: Payer: Self-pay

## 2019-04-05 ENCOUNTER — Encounter: Payer: Self-pay | Admitting: Advanced Practice Midwife

## 2019-04-05 DIAGNOSIS — O24419 Gestational diabetes mellitus in pregnancy, unspecified control: Secondary | ICD-10-CM | POA: Insufficient documentation

## 2019-04-05 LAB — CERVICOVAGINAL ANCILLARY ONLY
Bacterial Vaginitis (gardnerella): NEGATIVE
Candida Glabrata: NEGATIVE
Candida Vaginitis: NEGATIVE
Comment: NEGATIVE
Comment: NEGATIVE
Comment: NEGATIVE

## 2019-04-05 LAB — CBC WITH DIFFERENTIAL/PLATELET
Basophils Absolute: 0 10*3/uL (ref 0.0–0.2)
Basos: 0 %
EOS (ABSOLUTE): 0.1 10*3/uL (ref 0.0–0.4)
Eos: 1 %
Hematocrit: 33.9 % — ABNORMAL LOW (ref 34.0–46.6)
Hemoglobin: 11.4 g/dL (ref 11.1–15.9)
Immature Grans (Abs): 0.1 10*3/uL (ref 0.0–0.1)
Immature Granulocytes: 1 %
Lymphocytes Absolute: 1.8 10*3/uL (ref 0.7–3.1)
Lymphs: 17 %
MCH: 27.9 pg (ref 26.6–33.0)
MCHC: 33.6 g/dL (ref 31.5–35.7)
MCV: 83 fL (ref 79–97)
Monocytes Absolute: 0.5 10*3/uL (ref 0.1–0.9)
Monocytes: 5 %
Neutrophils Absolute: 8.1 10*3/uL — ABNORMAL HIGH (ref 1.4–7.0)
Neutrophils: 76 %
Platelets: 214 10*3/uL (ref 150–450)
RBC: 4.08 x10E6/uL (ref 3.77–5.28)
RDW: 13 % (ref 11.7–15.4)
WBC: 10.5 10*3/uL (ref 3.4–10.8)

## 2019-04-05 LAB — HIV ANTIBODY (ROUTINE TESTING W REFLEX): HIV Screen 4th Generation wRfx: NONREACTIVE

## 2019-04-05 LAB — GLUCOSE TOLERANCE, 2 HOURS W/ 1HR
Glucose, 1 hour: 145 mg/dL (ref 65–179)
Glucose, 2 hour: 116 mg/dL (ref 65–152)
Glucose, Fasting: 133 mg/dL — ABNORMAL HIGH (ref 65–91)

## 2019-04-05 LAB — RPR: RPR Ser Ql: NONREACTIVE

## 2019-04-05 MED ORDER — ACCU-CHEK FASTCLIX LANCETS MISC: 1.0000 [IU] | Freq: Four times a day (QID) | 12 refills | Status: DC

## 2019-04-05 MED ORDER — ACCU-CHEK GUIDE VI STRP: ORAL_STRIP | 12 refills | Status: DC

## 2019-04-05 MED ORDER — ACCU-CHEK GUIDE W/DEVICE KIT: 1.0000 | PACK | Freq: Four times a day (QID) | 0 refills | Status: DC

## 2019-04-05 NOTE — Telephone Encounter (Signed)
Patient has been informed of fasting glucose level and the need to start checking sugars once she meets with diabetes and nutrition. Medication called into [facility] on  [address].

## 2019-04-05 NOTE — Progress Notes (Signed)
done

## 2019-04-11 ENCOUNTER — Other Ambulatory Visit: Payer: Self-pay

## 2019-04-11 ENCOUNTER — Ambulatory Visit: Payer: Medicaid Other

## 2019-04-11 ENCOUNTER — Ambulatory Visit (INDEPENDENT_AMBULATORY_CARE_PROVIDER_SITE_OTHER): Payer: Medicaid Other

## 2019-04-11 VITALS — BP 121/78 | HR 72

## 2019-04-11 DIAGNOSIS — O09213 Supervision of pregnancy with history of pre-term labor, third trimester: Secondary | ICD-10-CM

## 2019-04-11 DIAGNOSIS — O0990 Supervision of high risk pregnancy, unspecified, unspecified trimester: Secondary | ICD-10-CM

## 2019-04-11 DIAGNOSIS — Z3A29 29 weeks gestation of pregnancy: Secondary | ICD-10-CM

## 2019-04-11 MED ORDER — HYDROXYPROGESTERONE CAPROATE 275 MG/1.1ML ~~LOC~~ SOAJ
275.0000 mg | Freq: Once | SUBCUTANEOUS | Status: AC
  Administered 2019-04-11: 275 mg via SUBCUTANEOUS

## 2019-04-11 NOTE — Progress Notes (Signed)
Patient presented to the office today for her 28- injection.  Given By: D.Cheree Ditto given left arm sq Dose:Makena  275 mg  HAL#[id number]

## 2019-04-18 ENCOUNTER — Encounter: Payer: Medicaid Other | Attending: Advanced Practice Midwife | Admitting: Registered"

## 2019-04-18 ENCOUNTER — Ambulatory Visit (INDEPENDENT_AMBULATORY_CARE_PROVIDER_SITE_OTHER): Payer: Medicaid Other | Admitting: Advanced Practice Midwife

## 2019-04-18 ENCOUNTER — Other Ambulatory Visit: Payer: Self-pay

## 2019-04-18 VITALS — BP 119/78 | HR 70 | Wt 241.1 lb

## 2019-04-18 DIAGNOSIS — Z3A30 30 weeks gestation of pregnancy: Secondary | ICD-10-CM

## 2019-04-18 DIAGNOSIS — O0990 Supervision of high risk pregnancy, unspecified, unspecified trimester: Secondary | ICD-10-CM

## 2019-04-18 DIAGNOSIS — O09213 Supervision of pregnancy with history of pre-term labor, third trimester: Secondary | ICD-10-CM | POA: Diagnosis not present

## 2019-04-18 DIAGNOSIS — O24419 Gestational diabetes mellitus in pregnancy, unspecified control: Secondary | ICD-10-CM | POA: Insufficient documentation

## 2019-04-18 DIAGNOSIS — R42 Dizziness and giddiness: Secondary | ICD-10-CM

## 2019-04-18 DIAGNOSIS — O09899 Supervision of other high risk pregnancies, unspecified trimester: Secondary | ICD-10-CM

## 2019-04-18 MED ORDER — HYDROXYPROGESTERONE CAPROATE 275 MG/1.1ML ~~LOC~~ SOAJ
275.0000 mg | Freq: Once | SUBCUTANEOUS | Status: AC
  Administered 2019-04-18: 275 mg via SUBCUTANEOUS

## 2019-04-18 NOTE — Patient Instructions (Signed)
Gestational Diabetes Mellitus, Diagnosis Gestational diabetes (gestational diabetes mellitus) is a short-term (temporary) form of diabetes that can happen during pregnancy. It goes away after you give birth. It may be caused by one or both of these problems:  Your pancreas does not make enough of a hormone called insulin.  Your body does not respond in a normal way to insulin that it makes. Insulin lets sugars (glucose) go into cells in the body. This gives you energy. If you have diabetes, sugars cannot get into cells. This causes high blood sugar (hyperglycemia). If you get gestational diabetes, you are:  More likely to get it if you get pregnant again.  More likely to develop type 2 diabetes in the future. If gestational diabetes is treated, it may not hurt you or your baby. Your doctor will set treatment goals for you. In general, you should have these blood sugar levels:  After not eating for a long time (fasting): 95 mg/dL (5.3 mmol/L).  After meals (postprandial): ? One hour after a meal: at or below 140 mg/dL (7.8 mmol/L). ? Two hours after a meal: at or below 120 mg/dL (6.7 mmol/L).  A1c (hemoglobin A1c) level: 6-6.5%. Follow these instructions at home: Questions to ask your doctor   You may want to ask these questions: ? Do I need to meet with a diabetes educator? ? What equipment will I need to care for myself at home? ? What medicines do I need? When should I take them? ? How often do I need to check my blood sugar? ? What number can I call if I have questions? ? When is my next doctor's visit? General instructions  Take over-the-counter and prescription medicines only as told by your doctor.  Stay at a healthy weight during pregnancy.  Keep all follow-up visits as told by your doctor. This is important. Contact a doctor if:  Your blood sugar is at or above 240 mg/dL (13.3 mmol/L).  Your blood sugar is at or above 200 mg/dL (11.1 mmol/L) and you have ketones in  your pee (urine).  You have been sick or have had a fever for 2 days or more and you are not getting better.  You have any of these problems for more than 6 hours: ? You cannot eat or drink. ? You feel sick to your stomach (nauseous). ? You throw up (vomit). ? You have watery poop (diarrhea). Get help right away if:  Your blood sugar is lower than 54 mg/dL (3 mmol/L).  You get confused.  You have trouble: ? Thinking clearly. ? Breathing.  Your baby moves less than normal.  You have any of these: ? Moderate or large ketone levels in your pee. ? Blood coming from your vagina. ? Unusual fluid coming from your vagina. ? Early contractions. These may feel like tightness in your belly. Summary  Gestational diabetes is a short-term form of diabetes. It can happen while you are pregnant. It goes away after you give birth.  If gestational diabetes is treated, it may not hurt you or your baby. Your doctor will set treatment goals for you.  Keep all follow-up visits as told by your doctor. This is important. This information is not intended to replace advice given to you by your health care provider. Make sure you discuss any questions you have with your health care provider. Document Revised: 04/21/2017 Document Reviewed: 04/18/2015 Elsevier Patient Education  2020 [employer]  

## 2019-04-18 NOTE — Progress Notes (Signed)
Makena today

## 2019-04-18 NOTE — Progress Notes (Signed)
   PRENATAL VISIT NOTE  Subjective:  Olivia Park is a 34 y.o. [phone] at [redacted]w[redacted]d being seen today for ongoing prenatal care.  She is currently monitored for the following issues for this high-risk pregnancy and has Postpartum care and examination; History of gestational diabetes; History of gestational hypertension; Supervision of high risk pregnancy, antepartum; History of preterm delivery, currently pregnant; Short interval between pregnancies affecting pregnancy, antepartum; UTI (urinary tract infection) during pregnancy; and Gestational diabetes on their problem list.  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 She said she felt as if she overexerted herself and "needed to take a break". Her symptoms quickly resolved with rest and hydration. She had a similar episode while lying in bed last Saturday 04/14/2019 that involved an episode of emesis.    Contractions: Not present.  Movement: Present. Denies leaking of fluid.   The following portions of the patient's history were reviewed and updated as appropriate: allergies, current medications, past family history, past medical history, past social history, past surgical history and problem list. Problem list updated.  Objective:   Vitals:   04/18/19 1120  BP: 119/78  Pulse: 70  Weight: 241 lb 1.6 oz (109.4 kg)    Fetal Status: Fetal Heart Rate (bpm): 132 Fundal Height: 30 cm Movement: Present     General:  Alert, oriented and cooperative. Patient is in no acute distress.  Skin: Skin is warm and dry. No rash noted.   Cardiovascular: Normal heart rate noted  Respiratory: Normal respiratory effort, no problems with respiration noted  Abdomen: Soft, gravid, appropriate for gestational age.  Pain/Pressure: Absent     Pelvic: Cervical exam deferred        Extremities: Normal range of motion.     Mental Status: Normal mood and affect. Normal behavior. Normal judgment and thought content.    Assessment and Plan:  Pregnancy: [id number] at [redacted]w[redacted]d  1. Supervision of high risk pregnancy, antepartum - Continue routine care - BP stable, FH appropriate for dates  2. Gestational diabetes mellitus (GDM) in third trimester, gestational diabetes method of control unspecified - Diabetes Educator first visit today  3. Dizziness - Reviewed hemodilution, physical activity and glucose control as possible origins of episodes - Continue to monitor   4. History of preterm delivery, currently pregnant - Continue weekly Makena  Preterm labor symptoms and general obstetric precautions including but not limited to vaginal bleeding, contractions, leaking of fluid and fetal movement were reviewed in detail with the patient. Please refer to After Visit Summary for other counseling recommendations.   For MD next visit  No follow-ups on file.  Future Appointments  Date Time Provider Department Center  04/24/2019  1:15 PM [facility] [place] 4 [facility]  04/24/2019  1:20 PM [facility] NURSE [facility]  04/25/2019  1:15 PM [facility] NURSE [facility]  05/02/2019  1:45 PM Calvert Cantor, CNM [facility]  05/09/2019  1:15 PM [facility] NURSE [facility]  05/16/2019  1:15 PM Calvert Cantor, CNM [facility]  05/23/2019  1:15 PM [facility] NURSE [facility]    Calvert Cantor, CNM

## 2019-04-19 ENCOUNTER — Encounter: Payer: Self-pay | Admitting: Registered"

## 2019-04-19 NOTE — Progress Notes (Signed)
Patient was seen on 04/18/19 for Gestational Diabetes self-management class at [facility]. The following learning objectives were met by the patient during this course:   States the definition of Gestational Diabetes  States why dietary management is important in controlling blood glucose  Describes the effects each nutrient has on blood glucose levels  Demonstrates ability to create a balanced meal plan  Demonstrates carbohydrate counting   States when to check blood glucose levels  Demonstrates proper blood glucose monitoring techniques  States the effect of stress and exercise on blood glucose levels  States the importance of limiting caffeine and abstaining from alcohol and smoking  Blood glucose monitor given: Accu-che[employer] Guide Me Lot # [id number] Exp: 04/17/20 Blood glucose reading: 127 mg/dL  Patient instructed to monitor glucose levels: FBS: 60 - <95; 1 hour: <140; 2 hour: <120  Patient received handouts:  Nutrition Diabetes and Pregnancy, including carb counting list  Patient will be seen for follow-up as needed.

## 2019-04-24 ENCOUNTER — Other Ambulatory Visit (HOSPITAL_COMMUNITY): Payer: Self-pay | Admitting: *Deleted

## 2019-04-24 ENCOUNTER — Other Ambulatory Visit: Payer: Self-pay

## 2019-04-24 ENCOUNTER — Ambulatory Visit (HOSPITAL_COMMUNITY): Payer: Medicaid Other | Admitting: *Deleted

## 2019-04-24 ENCOUNTER — Ambulatory Visit (HOSPITAL_COMMUNITY)
Admission: RE | Admit: 2019-04-24 | Discharge: 2019-04-24 | Disposition: A | Discharge status: Home / Self Care | Payer: Medicaid Other | Source: Ambulatory Visit | Attending: Obstetrics and Gynecology | Admitting: Obstetrics and Gynecology

## 2019-04-24 ENCOUNTER — Encounter (HOSPITAL_COMMUNITY): Payer: Self-pay | Admitting: *Deleted

## 2019-04-24 DIAGNOSIS — Z362 Encounter for other antenatal screening follow-up: Secondary | ICD-10-CM

## 2019-04-24 DIAGNOSIS — O24410 Gestational diabetes mellitus in pregnancy, diet controlled: Secondary | ICD-10-CM | POA: Diagnosis not present

## 2019-04-24 DIAGNOSIS — Z6841 Body Mass Index (BMI) 40.0 and over, adult: Secondary | ICD-10-CM

## 2019-04-24 DIAGNOSIS — E669 Obesity, unspecified: Secondary | ICD-10-CM | POA: Diagnosis not present

## 2019-04-24 DIAGNOSIS — Z3A31 31 weeks gestation of pregnancy: Secondary | ICD-10-CM | POA: Diagnosis not present

## 2019-04-24 DIAGNOSIS — O99213 Obesity complicating pregnancy, third trimester: Secondary | ICD-10-CM | POA: Diagnosis not present

## 2019-04-24 DIAGNOSIS — O09213 Supervision of pregnancy with history of pre-term labor, third trimester: Secondary | ICD-10-CM

## 2019-04-24 DIAGNOSIS — O0990 Supervision of high risk pregnancy, unspecified, unspecified trimester: Secondary | ICD-10-CM

## 2019-04-24 DIAGNOSIS — O09899 Supervision of other high risk pregnancies, unspecified trimester: Secondary | ICD-10-CM

## 2019-04-25 ENCOUNTER — Ambulatory Visit (INDEPENDENT_AMBULATORY_CARE_PROVIDER_SITE_OTHER): Payer: Medicaid Other | Admitting: *Deleted

## 2019-04-25 VITALS — BP 134/85 | HR 102

## 2019-04-25 DIAGNOSIS — O09213 Supervision of pregnancy with history of pre-term labor, third trimester: Secondary | ICD-10-CM | POA: Diagnosis not present

## 2019-04-25 DIAGNOSIS — Z3A31 31 weeks gestation of pregnancy: Secondary | ICD-10-CM | POA: Diagnosis not present

## 2019-04-25 DIAGNOSIS — O0990 Supervision of high risk pregnancy, unspecified, unspecified trimester: Secondary | ICD-10-CM

## 2019-04-25 MED ORDER — HYDROXYPROGESTERONE CAPROATE 275 MG/1.1ML ~~LOC~~ SOAJ
275.0000 mg | Freq: Once | SUBCUTANEOUS | Status: AC
  Administered 2019-04-25: 275 mg via SUBCUTANEOUS

## 2019-04-25 NOTE — Progress Notes (Signed)
Attestation of Attending Supervision of clinical support staff: I agree with the care provided to this patient and was available for any consultation.  I have reviewed the RN's note and chart. I was available for consult and to see the patient if needed.   Corby Villasenor Niles Darci Lykins, MD, MPH, ABFM Attending Physician Faculty Practice- [facility]  

## 2019-04-25 NOTE — Progress Notes (Signed)
Pt here for 17p, pt denies any issues at this time. Pt tolerated injection well. To follow up next week.   Scheryl Marten, RN

## 2019-05-02 ENCOUNTER — Encounter: Payer: Medicaid Other | Admitting: Advanced Practice Midwife

## 2019-05-02 ENCOUNTER — Ambulatory Visit (HOSPITAL_COMMUNITY)
Admission: RE | Admit: 2019-05-02 | Discharge: 2019-05-02 | Disposition: A | Discharge status: Home / Self Care | Payer: Medicaid Other | Source: Ambulatory Visit | Attending: Maternal & Fetal Medicine | Admitting: Maternal & Fetal Medicine

## 2019-05-02 ENCOUNTER — Ambulatory Visit (HOSPITAL_COMMUNITY): Payer: Medicaid Other | Admitting: *Deleted

## 2019-05-02 ENCOUNTER — Other Ambulatory Visit: Payer: Self-pay

## 2019-05-02 ENCOUNTER — Encounter (HOSPITAL_COMMUNITY): Payer: Self-pay

## 2019-05-02 DIAGNOSIS — O0990 Supervision of high risk pregnancy, unspecified, unspecified trimester: Secondary | ICD-10-CM

## 2019-05-02 DIAGNOSIS — Z3A32 32 weeks gestation of pregnancy: Secondary | ICD-10-CM

## 2019-05-02 DIAGNOSIS — O09293 Supervision of pregnancy with other poor reproductive or obstetric history, third trimester: Secondary | ICD-10-CM | POA: Diagnosis not present

## 2019-05-02 DIAGNOSIS — O4103X0 Oligohydramnios, third trimester, not applicable or unspecified: Secondary | ICD-10-CM | POA: Diagnosis not present

## 2019-05-02 DIAGNOSIS — O09899 Supervision of other high risk pregnancies, unspecified trimester: Secondary | ICD-10-CM | POA: Insufficient documentation

## 2019-05-02 DIAGNOSIS — O24410 Gestational diabetes mellitus in pregnancy, diet controlled: Secondary | ICD-10-CM

## 2019-05-02 DIAGNOSIS — O99213 Obesity complicating pregnancy, third trimester: Secondary | ICD-10-CM | POA: Diagnosis not present

## 2019-05-03 ENCOUNTER — Encounter: Payer: Self-pay | Admitting: Obstetrics & Gynecology

## 2019-05-03 ENCOUNTER — Ambulatory Visit (INDEPENDENT_AMBULATORY_CARE_PROVIDER_SITE_OTHER): Payer: Medicaid Other | Admitting: Obstetrics & Gynecology

## 2019-05-03 VITALS — BP 117/80 | HR 92 | Wt 243.0 lb

## 2019-05-03 DIAGNOSIS — R12 Heartburn: Secondary | ICD-10-CM

## 2019-05-03 DIAGNOSIS — O09893 Supervision of other high risk pregnancies, third trimester: Secondary | ICD-10-CM

## 2019-05-03 DIAGNOSIS — O219 Vomiting of pregnancy, unspecified: Secondary | ICD-10-CM

## 2019-05-03 DIAGNOSIS — O0993 Supervision of high risk pregnancy, unspecified, third trimester: Secondary | ICD-10-CM

## 2019-05-03 DIAGNOSIS — Z3A32 32 weeks gestation of pregnancy: Secondary | ICD-10-CM | POA: Diagnosis not present

## 2019-05-03 DIAGNOSIS — O26893 Other specified pregnancy related conditions, third trimester: Secondary | ICD-10-CM

## 2019-05-03 DIAGNOSIS — O24410 Gestational diabetes mellitus in pregnancy, diet controlled: Secondary | ICD-10-CM

## 2019-05-03 DIAGNOSIS — O09212 Supervision of pregnancy with history of pre-term labor, second trimester: Secondary | ICD-10-CM

## 2019-05-03 DIAGNOSIS — O09899 Supervision of other high risk pregnancies, unspecified trimester: Secondary | ICD-10-CM

## 2019-05-03 DIAGNOSIS — O0990 Supervision of high risk pregnancy, unspecified, unspecified trimester: Secondary | ICD-10-CM

## 2019-05-03 DIAGNOSIS — Z23 Encounter for immunization: Secondary | ICD-10-CM | POA: Diagnosis not present

## 2019-05-03 MED ORDER — TETANUS-DIPHTH-ACELL PERTUSSIS 5-2.5-18.5 LF-MCG/0.5 IM SUSP
0.5000 mL | Freq: Once | INTRAMUSCULAR | Status: AC
  Administered 2019-05-03: 0.5 mL via INTRAMUSCULAR

## 2019-05-03 MED ORDER — HYDROXYPROGESTERONE CAPROATE 275 MG/1.1ML ~~LOC~~ SOAJ
275.0000 mg | Freq: Once | SUBCUTANEOUS | Status: AC
  Administered 2019-05-03: 275 mg via SUBCUTANEOUS

## 2019-05-03 MED ORDER — PROMETHAZINE HCL 25 MG PO TABS: 25.0000 mg | ORAL_TABLET | Freq: Four times a day (QID) | ORAL | 2 refills | Status: DC | PRN

## 2019-05-03 MED ORDER — PANTOPRAZOLE SODIUM 40 MG PO TBEC: 40.0000 mg | DELAYED_RELEASE_TABLET | Freq: Every day | ORAL | 2 refills | Status: DC

## 2019-05-03 NOTE — Progress Notes (Signed)
PRENATAL VISIT NOTE  Subjective:  Olivia Park is a 34 y.o. [phone] at [redacted]w[redacted]d being seen today for ongoing prenatal care.  She is currently monitored for the following issues for this high-risk pregnancy and has History of gestational hypertension; Supervision of high risk pregnancy, antepartum; History of preterm delivery, currently pregnant; Short interval between pregnancies affecting pregnancy, antepartum; UTI (urinary tract infection) during pregnancy; and Gestational diabetes mellitus (GDM) in third trimester on their problem list.  Patient reports heartburn, nausea and vomiting.  Contractions: Irritability. Vag. Bleeding: None.  Movement: Present. Denies leaking of fluid.   The following portions of the patient's history were reviewed and updated as appropriate: allergies, current medications, past family history, past medical history, past social history, past surgical history and problem list.   Objective:   Vitals:   05/03/19 1325  BP: 117/80  Pulse: 92  Weight: 243 lb (110.2 kg)    Fetal Status: Fetal Heart Rate (bpm): 148   Movement: Present     General:  Alert, oriented and cooperative. Patient is in no acute distress.  Skin: Skin is warm and dry. No rash noted.   Cardiovascular: Normal heart rate noted  Respiratory: Normal respiratory effort, no problems with respiration noted  Abdomen: Soft, gravid, appropriate for gestational age.  Pain/Pressure: Present     Pelvic: Cervical exam deferred        Extremities: Normal range of motion.  Edema: None  Mental Status: Normal mood and affect. Normal behavior. Normal judgment and thought content.   Imaging: [place] MFM OB FOLLOW UP  Result Date: 04/24/2019 ----------------------------------------------------------------------  OBSTETRICS REPORT                       (Signed Final 04/24/2019 02:10 pm) ---------------------------------------------------------------------- Patient Info  ID #:       [id number]                           D.O.B.:  April 07, 1985 (33 yrs)  Name:       Olivia Park               Visit Date: 04/24/2019 01:26 pm ---------------------------------------------------------------------- Performed By  Performed By:     Lenise Arena        Ref. Address:     [address]                    [address]                                                             [address]  Attending:        Lin Landsman      [facility]                    MD                                       [facility]  Referred By:      [facility] Mila Mer[facility] ---------------------------------------------------------------------- Orders   #  Description  Code         Ordered By   1  [place] MFM OB FOLLOW UP                  [id number]     [place]  ----------------------------------------------------------------------   #  Order #                    Accession #                 Episode #   1  [id number]                  [id number]                  [id number]  ---------------------------------------------------------------------- Indications   Gestational diabetes in pregnancy, diet        O24.410   controlled   Encounter for other antenatal screening        Z36.2   follow-up (low risk NIPS, 6.2VO)   Obesity complicating pregnancy, second         O99.212   trimester   Poor obstetric history: Previous gestational   O09.299   diabetes   Poor obstetric history: Previous               O09.299   preeclampsia / eclampsia/gestational HTN   Poor obstetric history: Previous preterm       O09.219   delivery, antepartum ([redacted]w[redacted]d)   [redacted] weeks gestation of pregnancy                Z3A.31  ---------------------------------------------------------------------- Vital Signs  Weight (lb): 241                               Height:        5'5"  BMI:         40.1 ---------------------------------------------------------------------- Fetal Evaluation  Num Of Fetuses:         1  Fetal Heart Rate(bpm):  155  Cardiac Activity:       Observed   Presentation:           Cephalic  Placenta:               Anterior  P. Cord Insertion:      Previously Visualized  AFI Sum(cm)     %Tile       Largest Pocket(cm)  6.85            < 3         2.74  RUQ(cm)       RLQ(cm)       LUQ(cm)        LLQ(cm)  2.68          0             1.43           2.74 ---------------------------------------------------------------------- Biometry  BPD:        80  mm     G. Age:  32w 1d         70  %    CI:        77.77   %    70 - 86  FL/HC:      19.8   %    19.3 - 21.3  HC:      287.1  mm     G. Age:  31w 4d         24  %    HC/AC:      0.96        0.96 - 1.17  AC:      299.5  mm     G. Age:  33w 6d         98  %    FL/BPD:     71.1   %    71 - 87  FL:       56.9  mm     G. Age:  29w 6d          9  %    FL/AC:      19.0   %    20 - 24  Est. FW:    1957  gm      4 lb 5 oz     78  % ---------------------------------------------------------------------- OB History  Gravidity:    3         Term:   2        Prem:   0        SAB:   0  TOP:          0       Ectopic:  0        Living: 2 ---------------------------------------------------------------------- Gestational Age  LMP:           31w 1d        Date:  09/18/18                 EDD:   06/25/19  U/S Today:     31w 6d                                        EDD:   06/20/19  Best:          31w 1d     Det. By:  LMP  (09/18/18)          EDD:   06/25/19 ---------------------------------------------------------------------- Anatomy  Cranium:               Appears normal         Aortic Arch:            Previously seen  Cavum:                 Appears normal         Ductal Arch:            Previously seen  Ventricles:            Appears normal         Diaphragm:              Previously seen  Choroid Plexus:        Previously seen        Stomach:                Appears normal, left  sided  Cerebellum:            Previously seen         Abdomen:                Appears normal  Posterior Fossa:       Previously seen        Abdominal Wall:         Previously seen  Nuchal Fold:           Previously seen        Cord Vessels:           Previously seen  Face:                  Orbits and profile     Kidneys:                Appear normal                         previously seen  Lips:                  Previously seen        Bladder:                Appears normal  Thoracic:              Appears normal         Spine:                  Previously seen  Heart:                 Previously seen        Upper Extremities:      Previously seen  RVOT:                  Previously seen        Lower Extremities:      Previously seen  LVOT:                  Previously seen  Other:  Heels visualized. Rt 5th visualized. Technically difficult due to fetal          position. ---------------------------------------------------------------------- Cervix Uterus Adnexa  Cervix  Not visualized (advanced GA >24wks) ---------------------------------------------------------------------- Impression  Normal interval growth  A1GDM  Normal amniotic fluid (subjectivey) however, objectively the  amniotic fluid measures low normal.  Obesity with BMI 40  I encouraged Ms. Rasch to continue to hydrate. ---------------------------------------------------------------------- Recommendations  Follow up in 1 week for limited amiotic fluid assessement.  Repeat growth in 4 weeks. ----------------------------------------------------------------------               Lin Landsman, MD Electronically Signed Final Report   04/24/2019 02:10 pm ----------------------------------------------------------------------  [place] MFM OB LIMITED  Result Date: 05/02/2019 ----------------------------------------------------------------------  OBSTETRICS REPORT                       (Signed Final 05/02/2019 10:21 pm) ---------------------------------------------------------------------- Patient Info  ID #:        [id number]                          D.O.B.:  05-15-1985 (33 yrs)  Name:       SINDIA GAMBLES               Visit Date: 05/02/2019 04:22 pm ---------------------------------------------------------------------- Performed By  Performed  By:     Ellin Saba        Ref. Address:      [address]                    [address]                                                              [address]  Attending:        Lin Landsman      [facility]                    MD                                        Fetal Care  Referred By:      [facility] ---------------------------------------------------------------------- Orders   #  Description                          Code         Ordered By   1  [place] MFM OB LIMITED                    [id number]     Lin Landsman  ----------------------------------------------------------------------   #  Order #                    Accession #                 Episode #   1  [id number]                  [id number]                  [id number]  ---------------------------------------------------------------------- Indications   Gestational diabetes in pregnancy, diet        O24.410   controlled   [redacted] weeks gestation of pregnancy                Z3A.32   Encounter for other antenatal screening        Z36.2   follow-up (low risk NIPS, 5.3FF)   Obesity complicating pregnancy, second         O99.212   trimester   Poor obstetric history: Previous gestational   O09.299   diabetes   Poor obstetric history: Previous               O09.299   preeclampsia / eclampsia/gestational HTN   Poor obstetric history: Previous preterm       O09.219   delivery, antepartum ([redacted]w[redacted]d)   Oligohydramnios / Decreased amniotic fluid     O41.00X0   volume  ---------------------------------------------------------------------- Vital Signs  Height:        5'5"  ---------------------------------------------------------------------- Fetal Evaluation  Num Of Fetuses:          1  Fetal Heart Rate(bpm):   144  Cardiac Activity:        Observed  Presentation:            Cephalic  Amniotic Fluid  AFI FV:      Within normal limits  AFI Sum(cm)     %Tile       Largest Pocket(cm)  11.94           31          5.13  RUQ(cm)                     LUQ(cm)        LLQ(cm)  3.01                        5.13           3.8 ---------------------------------------------------------------------- OB History  Gravidity:    3         Term:   2        Prem:   0        SAB:   0  TOP:          0       Ectopic:  0        Living: 2 ---------------------------------------------------------------------- Gestational Age  LMP:           32w 2d        Date:  09/18/18                 EDD:   06/25/19  Best:          Armida Sans32w 2d     Det. By:  LMP  (09/18/18)          EDD:   06/25/19 ---------------------------------------------------------------------- Anatomy  Stomach:               Appears normal, left   Kidneys:                Appear normal                         sided  Cord Vessels:          Appears normal (3      Bladder:                Appears normal                         vessel cord) ---------------------------------------------------------------------- Cervix Uterus Adnexa  Cervix  Not visualized (advanced GA >24wks) ---------------------------------------------------------------------- Impression  Limited exam A1 GDM  Follow up to assess amniotic fluid given prior low amniotic  fluid index.  Today the amnioti fluid is improved. ---------------------------------------------------------------------- Recommendations  Ms. Tiburcio PeaHarris was previously scheduled for growth on 2/23. ----------------------------------------------------------------------               Lin Landsmanorenthian Booker, MD Electronically Signed Final Report   05/02/2019 10:21 pm  ----------------------------------------------------------------------   Assessment and Plan:  Pregnancy: [id number] at 492w3d 1. Diet controlled gestational diabetes mellitus (GDM) in third trimester Reports CBGs within normal limits, will send picture of values when she gets home via MyChart. Continue diet control. Continue growth scans as per MFM.    2. History of preterm delivery,  currently pregnant Continue weekly 17P, received dose today  3. Heartburn in pregnancy in third trimester Protonix prescribed. - pantoprazole (PROTONIX) 40 MG tablet; Take 1 tablet (40 mg total) by mouth daily.  Dispense: 30 tablet; Refill: 2  4. Nausea and vomiting in pregnancy Phenergan prescribed. - promethazine (PHENERGAN) 25 MG tablet; Take 1 tablet (25 mg total) by mouth every 6 (six) hours as needed for nausea or vomiting.  Dispense: 30 tablet; Refill: 2  5. Supervision of high risk pregnancy, antepartum Both vaccines given. Counseled about PP Nexplanon.  - Flu Vaccine QUAD 36+ mos IM (Fluarix & Fluzone Quad PF - Tdap (BOOSTRIX) injection 0.5 mL Preterm labor symptoms and general obstetric precautions including but not limited to vaginal bleeding, contractions, leaking of fluid and fetal movement were reviewed in detail with the patient. Please refer to After Visit Summary for other counseling recommendations.   Return in about 1 week (around 05/10/2019) for 17P only//  2 weeks: 17P and OFFICE OB Visit.  Future Appointments  Date Time Provider Department Center  05/10/2019  1:15 PM [facility] NURSE [facility]  05/17/2019 11:00 AM Christiano Blandon, Jethro Bastos, MD [facility]  05/22/2019  1:15 PM [facility] NURSE [facility]  05/22/2019  1:15 PM [facility] [place] 4 [facility]  05/24/2019  1:15 PM [facility] NURSE [facility]    Jaynie Collins, MD

## 2019-05-03 NOTE — Patient Instructions (Signed)
Return to office for any scheduled appointments. Call the office or go to the MAU at [facility] if:  You begin to have strong, frequent contractions  Your water breaks.  Sometimes it is a big gush of fluid, sometimes it is just a trickle that keeps getting your panties wet or running down your legs  You have vaginal bleeding.  It is normal to have a small amount of spotting if your cervix was checked.   You do not feel your baby moving like normal.  If you do not, get something to eat and drink and lay down and focus on feeling your baby move.   If your baby is still not moving like normal, you should call the office or go to MAU.  Any other obstetric concerns.   

## 2019-05-09 ENCOUNTER — Ambulatory Visit: Payer: Medicaid Other

## 2019-05-10 ENCOUNTER — Other Ambulatory Visit: Payer: Self-pay

## 2019-05-10 ENCOUNTER — Ambulatory Visit (INDEPENDENT_AMBULATORY_CARE_PROVIDER_SITE_OTHER): Payer: Medicaid Other | Admitting: *Deleted

## 2019-05-10 VITALS — BP 114/81 | HR 84

## 2019-05-10 DIAGNOSIS — Z3A33 33 weeks gestation of pregnancy: Secondary | ICD-10-CM

## 2019-05-10 DIAGNOSIS — O09213 Supervision of pregnancy with history of pre-term labor, third trimester: Secondary | ICD-10-CM

## 2019-05-10 DIAGNOSIS — O0990 Supervision of high risk pregnancy, unspecified, unspecified trimester: Secondary | ICD-10-CM

## 2019-05-10 MED ORDER — HYDROXYPROGESTERONE CAPROATE 275 MG/1.1ML ~~LOC~~ SOAJ
275.0000 mg | Freq: Once | SUBCUTANEOUS | Status: AC
  Administered 2019-05-10: 13:00:00 275 mg via SUBCUTANEOUS

## 2019-05-10 NOTE — Progress Notes (Signed)
Pt here today for 17p, pt denies any issues at this time. Pt tolerated injection well. Will follow up next week.

## 2019-05-10 NOTE — Progress Notes (Signed)
Patient seen and assessed by nursing staff.  Agree with documentation and plan.  

## 2019-05-15 ENCOUNTER — Ambulatory Visit (HOSPITAL_COMMUNITY): Payer: Medicaid Other

## 2019-05-16 ENCOUNTER — Ambulatory Visit (INDEPENDENT_AMBULATORY_CARE_PROVIDER_SITE_OTHER): Payer: Medicaid Other | Admitting: *Deleted

## 2019-05-16 ENCOUNTER — Encounter: Payer: Medicaid Other | Admitting: Advanced Practice Midwife

## 2019-05-16 ENCOUNTER — Telehealth: Payer: Self-pay | Admitting: *Deleted

## 2019-05-16 ENCOUNTER — Other Ambulatory Visit: Payer: Self-pay

## 2019-05-16 VITALS — BP 124/89 | HR 85

## 2019-05-16 DIAGNOSIS — O09213 Supervision of pregnancy with history of pre-term labor, third trimester: Secondary | ICD-10-CM | POA: Diagnosis not present

## 2019-05-16 DIAGNOSIS — O0990 Supervision of high risk pregnancy, unspecified, unspecified trimester: Secondary | ICD-10-CM

## 2019-05-16 DIAGNOSIS — Z3A34 34 weeks gestation of pregnancy: Secondary | ICD-10-CM

## 2019-05-16 MED ORDER — HYDROXYPROGESTERONE CAPROATE 275 MG/1.1ML ~~LOC~~ SOAJ
275.0000 mg | Freq: Once | SUBCUTANEOUS | Status: AC
  Administered 2019-05-16: 16:00:00 275 mg via SUBCUTANEOUS

## 2019-05-16 NOTE — Telephone Encounter (Signed)
Left message to call office to reschedule appointment.

## 2019-05-16 NOTE — Progress Notes (Signed)
ATTESTATION OF SUPERVISION OF RN: Evaluation and management procedures were performed by the RN under my supervision and collaboration. I have reviewed the nursing note and chart and agree with the management and plan for this patient.  Kaesen Rodriguez, CNM  

## 2019-05-16 NOTE — Telephone Encounter (Signed)
Pt informed of office being closed tomorrow, pt will pick up her Makena injection and have her mom give it to her tomorrow.

## 2019-05-16 NOTE — Progress Notes (Signed)
Pt mother decided to not give pt injection. Pt here for 17p, pt denies any issues at this time. Pt tolerated injection well.

## 2019-05-17 ENCOUNTER — Encounter: Payer: Medicaid Other | Admitting: Obstetrics & Gynecology

## 2019-05-22 ENCOUNTER — Other Ambulatory Visit (HOSPITAL_COMMUNITY): Payer: Self-pay | Admitting: *Deleted

## 2019-05-22 ENCOUNTER — Other Ambulatory Visit: Payer: Self-pay

## 2019-05-22 ENCOUNTER — Ambulatory Visit (HOSPITAL_COMMUNITY)
Admission: RE | Admit: 2019-05-22 | Discharge: 2019-05-22 | Disposition: A | Discharge status: Home / Self Care | Payer: Medicaid Other | Source: Ambulatory Visit | Attending: Maternal & Fetal Medicine | Admitting: Maternal & Fetal Medicine

## 2019-05-22 ENCOUNTER — Ambulatory Visit (HOSPITAL_COMMUNITY): Payer: Medicaid Other | Admitting: *Deleted

## 2019-05-22 ENCOUNTER — Encounter (HOSPITAL_COMMUNITY): Payer: Self-pay | Admitting: *Deleted

## 2019-05-22 DIAGNOSIS — O09899 Supervision of other high risk pregnancies, unspecified trimester: Secondary | ICD-10-CM | POA: Diagnosis not present

## 2019-05-22 DIAGNOSIS — O0990 Supervision of high risk pregnancy, unspecified, unspecified trimester: Secondary | ICD-10-CM | POA: Diagnosis not present

## 2019-05-22 DIAGNOSIS — O24410 Gestational diabetes mellitus in pregnancy, diet controlled: Secondary | ICD-10-CM | POA: Insufficient documentation

## 2019-05-22 DIAGNOSIS — Z3A35 35 weeks gestation of pregnancy: Secondary | ICD-10-CM

## 2019-05-22 DIAGNOSIS — Z362 Encounter for other antenatal screening follow-up: Secondary | ICD-10-CM

## 2019-05-22 DIAGNOSIS — O4103X0 Oligohydramnios, third trimester, not applicable or unspecified: Secondary | ICD-10-CM

## 2019-05-23 ENCOUNTER — Ambulatory Visit: Payer: Medicaid Other

## 2019-05-24 ENCOUNTER — Ambulatory Visit: Payer: Medicaid Other

## 2019-05-24 ENCOUNTER — Ambulatory Visit (INDEPENDENT_AMBULATORY_CARE_PROVIDER_SITE_OTHER): Payer: Medicaid Other | Admitting: Family Medicine

## 2019-05-24 ENCOUNTER — Other Ambulatory Visit (HOSPITAL_COMMUNITY)
Admission: RE | Admit: 2019-05-24 | Discharge: 2019-05-24 | Disposition: A | Discharge status: Home / Self Care | Payer: Medicaid Other | Source: Ambulatory Visit | Attending: Advanced Practice Midwife | Admitting: Advanced Practice Midwife

## 2019-05-24 ENCOUNTER — Other Ambulatory Visit: Payer: Self-pay

## 2019-05-24 VITALS — BP 127/86 | Wt 246.0 lb

## 2019-05-24 DIAGNOSIS — O0993 Supervision of high risk pregnancy, unspecified, third trimester: Secondary | ICD-10-CM

## 2019-05-24 DIAGNOSIS — O09899 Supervision of other high risk pregnancies, unspecified trimester: Secondary | ICD-10-CM

## 2019-05-24 DIAGNOSIS — Z3A35 35 weeks gestation of pregnancy: Secondary | ICD-10-CM | POA: Diagnosis not present

## 2019-05-24 DIAGNOSIS — O09893 Supervision of other high risk pregnancies, third trimester: Secondary | ICD-10-CM

## 2019-05-24 DIAGNOSIS — O09213 Supervision of pregnancy with history of pre-term labor, third trimester: Secondary | ICD-10-CM

## 2019-05-24 DIAGNOSIS — O24410 Gestational diabetes mellitus in pregnancy, diet controlled: Secondary | ICD-10-CM

## 2019-05-24 DIAGNOSIS — O0990 Supervision of high risk pregnancy, unspecified, unspecified trimester: Secondary | ICD-10-CM | POA: Diagnosis not present

## 2019-05-24 MED ORDER — HYDROXYPROGESTERONE CAPROATE 275 MG/1.1ML ~~LOC~~ SOAJ
275.0000 mg | Freq: Once | SUBCUTANEOUS | Status: AC
  Administered 2019-05-24: 11:00:00 275 mg via SUBCUTANEOUS

## 2019-05-24 NOTE — Progress Notes (Signed)
   PRENATAL VISIT NOTE  Subjective:  Olivia Park is a 34 y.o. [phone] at [redacted]w[redacted]d being seen today for ongoing prenatal care.  She is currently monitored for the following issues for this high-risk pregnancy and has History of gestational hypertension; Supervision of high risk pregnancy, antepartum; History of preterm delivery, currently pregnant; Short interval between pregnancies affecting pregnancy, antepartum; UTI (urinary tract infection) during pregnancy; and Gestational diabetes mellitus (GDM) in third trimester on their problem list.  Patient reports no complaints.  Contractions: Irritability. Vag. Bleeding: None.  Movement: Present. Denies leaking of fluid.   The following portions of the patient's history were reviewed and updated as appropriate: allergies, current medications, past family history, past medical history, past social history, past surgical history and problem list.   Objective:   Vitals:   05/24/19 1102  BP: 127/86  Weight: 246 lb (111.6 kg)    Fetal Status: Fetal Heart Rate (bpm): 144 Fundal Height: 37 cm Movement: Present  Presentation: Vertex  General:  Alert, oriented and cooperative. Patient is in no acute distress.  Skin: Skin is warm and dry. No rash noted.   Cardiovascular: Normal heart rate noted  Respiratory: Normal respiratory effort, no problems with respiration noted  Abdomen: Soft, gravid, appropriate for gestational age.  Pain/Pressure: Present     Pelvic: Cervical exam performed Dilation: 3.5 Effacement (%): 70 Station: -1  Extremities: Normal range of motion.     Mental Status: Normal mood and affect. Normal behavior. Normal judgment and thought content.   Assessment and Plan:  Pregnancy: [id number] at [redacted]w[redacted]d 1. Supervision of high risk pregnancy, antepartum Cultures today - Strep Gp B NAA - Cervicovaginal ancillary only( [place])  2. Diet controlled gestational diabetes mellitus (GDM) in third trimester See BabyScripts numbers, will  log here We discussed protein with bedtime snack and addition of exercise.Also with oligo, so may need early delivery U/s for growth ok 1/26 78%  3. History of preterm delivery, currently pregnant On Makena  Preterm labor symptoms and general obstetric precautions including but not limited to vaginal bleeding, contractions, leaking of fluid and fetal movement were reviewed in detail with the patient. Please refer to After Visit Summary for other counseling recommendations.   Return in 1 week (on 05/31/2019) for in person.  Future Appointments  Date Time Provider Department Center  05/29/2019 11:00 AM [place] Bing, MD [facility]  05/30/2019  1:15 PM [facility] NURSE [facility]  05/30/2019  1:15 PM [facility] [place] 4 [facility]    Reva Bores, MD

## 2019-05-24 NOTE — Patient Instructions (Signed)

## 2019-05-25 LAB — CERVICOVAGINAL ANCILLARY ONLY
Bacterial Vaginitis (gardnerella): NEGATIVE
Candida Glabrata: NEGATIVE
Candida Vaginitis: NEGATIVE
Chlamydia: NEGATIVE
Comment: NEGATIVE
Comment: NEGATIVE
Comment: NEGATIVE
Comment: NEGATIVE
Comment: NEGATIVE
Comment: NORMAL
Neisseria Gonorrhea: NEGATIVE
Trichomonas: NEGATIVE

## 2019-05-26 LAB — STREP GP B NAA: Strep Gp B NAA: POSITIVE — AB

## 2019-05-28 ENCOUNTER — Encounter: Payer: Self-pay | Admitting: Family Medicine

## 2019-05-28 DIAGNOSIS — O99820 Streptococcus B carrier state complicating pregnancy: Secondary | ICD-10-CM | POA: Insufficient documentation

## 2019-05-29 ENCOUNTER — Telehealth (INDEPENDENT_AMBULATORY_CARE_PROVIDER_SITE_OTHER): Payer: Medicaid Other | Admitting: Obstetrics and Gynecology

## 2019-05-29 ENCOUNTER — Other Ambulatory Visit: Payer: Self-pay

## 2019-05-29 DIAGNOSIS — Z3A36 36 weeks gestation of pregnancy: Secondary | ICD-10-CM

## 2019-05-29 DIAGNOSIS — Z6841 Body Mass Index (BMI) 40.0 and over, adult: Secondary | ICD-10-CM

## 2019-05-29 DIAGNOSIS — O2343 Unspecified infection of urinary tract in pregnancy, third trimester: Secondary | ICD-10-CM

## 2019-05-29 DIAGNOSIS — O09293 Supervision of pregnancy with other poor reproductive or obstetric history, third trimester: Secondary | ICD-10-CM

## 2019-05-29 DIAGNOSIS — O99213 Obesity complicating pregnancy, third trimester: Secondary | ICD-10-CM

## 2019-05-29 DIAGNOSIS — Z8759 Personal history of other complications of pregnancy, childbirth and the puerperium: Secondary | ICD-10-CM

## 2019-05-29 DIAGNOSIS — O0990 Supervision of high risk pregnancy, unspecified, unspecified trimester: Secondary | ICD-10-CM

## 2019-05-29 DIAGNOSIS — O2340 Unspecified infection of urinary tract in pregnancy, unspecified trimester: Secondary | ICD-10-CM

## 2019-05-29 DIAGNOSIS — O09899 Supervision of other high risk pregnancies, unspecified trimester: Secondary | ICD-10-CM

## 2019-05-29 DIAGNOSIS — O99210 Obesity complicating pregnancy, unspecified trimester: Secondary | ICD-10-CM | POA: Insufficient documentation

## 2019-05-29 NOTE — Progress Notes (Signed)
   TELEHEALTH VIRTUAL OBSTETRICS VISIT ENCOUNTER NOTE  Clinic: [facility]  I connected with Bing Plume on 05/29/19 at 11:00 AM EST by telephone at home and verified that I am speaking with the correct person using two identifiers.   I discussed the limitations, risks, security and privacy concerns of performing an evaluation and management service by telephone and the availability of in person appointments. I also discussed with the patient that there may be a patient responsible charge related to this service. The patient expressed understanding and agreed to proceed.  Subjective:  Olivia Park is a 34 y.o. [phone] at [redacted]w[redacted]d being followed for ongoing prenatal care.  She is currently monitored for the following issues for this high-risk pregnancy and has History of gestational hypertension; Supervision of high risk pregnancy, antepartum; History of preterm delivery, currently pregnant; Short interval between pregnancies affecting pregnancy, antepartum; UTI (urinary tract infection) during pregnancy; Gestational diabetes mellitus (GDM) in third trimester; Group B Streptococcus carrier, +RV culture, currently pregnant; BMI 40.0-44.9, adult (HCC); and Obesity in pregnancy on their problem list.  Patient reports no complaints. Reports fetal movement. Denies any contractions, bleeding or leaking of fluid.   The following portions of the patient's history were reviewed and updated as appropriate: allergies, current medications, past family history, past medical history, past social history, past surgical history and problem list.   Objective:  There were no vitals filed for this visit.  Babyscripts Data Reviewed: yes  General:  Alert, oriented and cooperative.   Mental Status: Normal mood and affect perceived. Normal judgment and thought content.  Rest of physical exam deferred due to type of encounter  Assessment and Plan:  Pregnancy: [id number] at  [redacted]w[redacted]d 1. BMI 40.0-44.9, adult (HCC)  2. Obesity in pregnancy  3. Supervision of high risk pregnancy, antepartum F/u bp tomorrow at [facility] u/s visit. Low normal fluid last week. U/s visit last week 66% efw and normal ac.   4. History of gestational hypertension Confirms low dose asa  5. Urinary tract infection in mother during pregnancy, antepartum toc nv  6. History of preterm delivery, currently pregnant Last makena this week  7. GDMa1 Normal BS log on diet.   8. GBS+ D/w her  Preterm labor symptoms and general obstetric precautions including but not limited to vaginal bleeding, contractions, leaking of fluid and fetal movement were reviewed in detail with the patient.  I discussed the assessment and treatment plan with the patient. The patient was provided an opportunity to ask questions and all were answered. The patient agreed with the plan and demonstrated an understanding of the instructions. The patient was advised to call back or seek an in-person office evaluation/go to MAU at [facility] for any urgent or concerning symptoms. Please refer to After Visit Summary for other counseling recommendations.   I provided 10 minutes of non-face-to-face time during this encounter. The visit was conducted via MyChart-medicine  No follow-ups on file.  Future Appointments  Date Time Provider Department Center  05/30/2019  1:15 PM [facility] NURSE [facility]  05/30/2019  1:15 PM [facility] [place] 4 [facility]-M[facility] MFC-US  06/06/2019  1:00 PM Federico Flake, MD [facility]    Bridgeton Bing, MD [facility] for [facility]

## 2019-05-29 NOTE — Progress Notes (Signed)
I connected with  Bing Plume on 05/29/19 at 11:00 AM EST by telephone and verified that I am speaking with the correct person using two identifiers.   I discussed the limitations, risks, security and privacy concerns of performing an evaluation and management service by telephone and the availability of in person appointments. I also discussed with the patient that there may be a patient responsible charge related to this service. The patient expressed understanding and agreed to proceed.  Scheryl Marten, RN 05/29/2019  10:57 AM    She has blood sugars written down and her cultures where done last week

## 2019-05-30 ENCOUNTER — Ambulatory Visit (HOSPITAL_COMMUNITY)
Admission: RE | Admit: 2019-05-30 | Discharge: 2019-05-30 | Disposition: A | Discharge status: Home / Self Care | Payer: Medicaid Other | Source: Ambulatory Visit | Attending: Obstetrics and Gynecology | Admitting: Obstetrics and Gynecology

## 2019-05-30 ENCOUNTER — Other Ambulatory Visit: Payer: Self-pay

## 2019-05-30 ENCOUNTER — Encounter (HOSPITAL_COMMUNITY): Payer: Self-pay

## 2019-05-30 ENCOUNTER — Ambulatory Visit (HOSPITAL_COMMUNITY): Payer: Medicaid Other | Admitting: *Deleted

## 2019-05-30 ENCOUNTER — Other Ambulatory Visit (HOSPITAL_COMMUNITY): Payer: Self-pay | Admitting: *Deleted

## 2019-05-30 DIAGNOSIS — O0990 Supervision of high risk pregnancy, unspecified, unspecified trimester: Secondary | ICD-10-CM | POA: Insufficient documentation

## 2019-05-30 DIAGNOSIS — O99820 Streptococcus B carrier state complicating pregnancy: Secondary | ICD-10-CM | POA: Diagnosis not present

## 2019-05-30 DIAGNOSIS — O99213 Obesity complicating pregnancy, third trimester: Secondary | ICD-10-CM | POA: Diagnosis not present

## 2019-05-30 DIAGNOSIS — Z3A36 36 weeks gestation of pregnancy: Secondary | ICD-10-CM

## 2019-05-30 DIAGNOSIS — O24410 Gestational diabetes mellitus in pregnancy, diet controlled: Secondary | ICD-10-CM | POA: Diagnosis not present

## 2019-05-30 DIAGNOSIS — O4103X0 Oligohydramnios, third trimester, not applicable or unspecified: Secondary | ICD-10-CM | POA: Diagnosis not present

## 2019-05-30 DIAGNOSIS — O288 Other abnormal findings on antenatal screening of mother: Secondary | ICD-10-CM

## 2019-05-30 DIAGNOSIS — O09899 Supervision of other high risk pregnancies, unspecified trimester: Secondary | ICD-10-CM | POA: Diagnosis not present

## 2019-06-05 ENCOUNTER — Ambulatory Visit (HOSPITAL_BASED_OUTPATIENT_CLINIC_OR_DEPARTMENT_OTHER)
Admission: RE | Admit: 2019-06-05 | Discharge: 2019-06-05 | Disposition: A | Discharge status: Home / Self Care | Payer: Medicaid Other | Source: Ambulatory Visit | Attending: Obstetrics and Gynecology | Admitting: Obstetrics and Gynecology

## 2019-06-05 ENCOUNTER — Encounter (HOSPITAL_COMMUNITY): Payer: Self-pay | Admitting: Obstetrics and Gynecology

## 2019-06-05 ENCOUNTER — Ambulatory Visit (HOSPITAL_COMMUNITY): Payer: Medicaid Other | Admitting: *Deleted

## 2019-06-05 ENCOUNTER — Encounter (HOSPITAL_COMMUNITY): Payer: Self-pay

## 2019-06-05 ENCOUNTER — Encounter (HOSPITAL_COMMUNITY): Payer: Self-pay | Admitting: Anesthesiology

## 2019-06-05 ENCOUNTER — Other Ambulatory Visit: Payer: Self-pay

## 2019-06-05 ENCOUNTER — Inpatient Hospital Stay (HOSPITAL_COMMUNITY)
Admission: AD | Admit: 2019-06-05 | Discharge: 2019-06-07 | DRG: 807 | Disposition: A | Discharge status: Home / Self Care | Payer: Medicaid Other | Attending: Obstetrics and Gynecology | Admitting: Obstetrics and Gynecology

## 2019-06-05 DIAGNOSIS — O09899 Supervision of other high risk pregnancies, unspecified trimester: Secondary | ICD-10-CM

## 2019-06-05 DIAGNOSIS — O134 Gestational [pregnancy-induced] hypertension without significant proteinuria, complicating childbirth: Secondary | ICD-10-CM | POA: Diagnosis not present

## 2019-06-05 DIAGNOSIS — Z20822 Contact with and (suspected) exposure to covid-19: Secondary | ICD-10-CM | POA: Diagnosis present

## 2019-06-05 DIAGNOSIS — O99213 Obesity complicating pregnancy, third trimester: Secondary | ICD-10-CM | POA: Diagnosis not present

## 2019-06-05 DIAGNOSIS — O24410 Gestational diabetes mellitus in pregnancy, diet controlled: Secondary | ICD-10-CM

## 2019-06-05 DIAGNOSIS — O4100X0 Oligohydramnios, unspecified trimester, not applicable or unspecified: Secondary | ICD-10-CM

## 2019-06-05 DIAGNOSIS — Z3A37 37 weeks gestation of pregnancy: Secondary | ICD-10-CM | POA: Diagnosis not present

## 2019-06-05 DIAGNOSIS — Z3490 Encounter for supervision of normal pregnancy, unspecified, unspecified trimester: Secondary | ICD-10-CM | POA: Diagnosis present

## 2019-06-05 DIAGNOSIS — O0990 Supervision of high risk pregnancy, unspecified, unspecified trimester: Secondary | ICD-10-CM

## 2019-06-05 DIAGNOSIS — O99820 Streptococcus B carrier state complicating pregnancy: Secondary | ICD-10-CM

## 2019-06-05 DIAGNOSIS — O288 Other abnormal findings on antenatal screening of mother: Secondary | ICD-10-CM | POA: Insufficient documentation

## 2019-06-05 DIAGNOSIS — O24420 Gestational diabetes mellitus in childbirth, diet controlled: Secondary | ICD-10-CM | POA: Diagnosis present

## 2019-06-05 DIAGNOSIS — O99214 Obesity complicating childbirth: Secondary | ICD-10-CM | POA: Diagnosis present

## 2019-06-05 DIAGNOSIS — O99824 Streptococcus B carrier state complicating childbirth: Secondary | ICD-10-CM | POA: Diagnosis not present

## 2019-06-05 DIAGNOSIS — E669 Obesity, unspecified: Secondary | ICD-10-CM | POA: Diagnosis present

## 2019-06-05 DIAGNOSIS — O1415 Severe pre-eclampsia, complicating the puerperium: Secondary | ICD-10-CM | POA: Diagnosis not present

## 2019-06-05 DIAGNOSIS — O1410 Severe pre-eclampsia, unspecified trimester: Secondary | ICD-10-CM

## 2019-06-05 LAB — CBC
HCT: 33.7 % — ABNORMAL LOW (ref 36.0–46.0)
HCT: 33.1 % — ABNORMAL LOW (ref 36.0–46.0)
Hemoglobin: 10.8 g/dL — ABNORMAL LOW (ref 12.0–15.0)
Hemoglobin: 10.7 g/dL — ABNORMAL LOW (ref 12.0–15.0)
MCH: 26.8 pg (ref 26.0–34.0)
MCH: 27.2 pg (ref 26.0–34.0)
MCHC: 32 g/dL (ref 30.0–36.0)
MCHC: 32.3 g/dL (ref 30.0–36.0)
MCV: 83.6 fL (ref 80.0–100.0)
MCV: 84 fL (ref 80.0–100.0)
Platelets: 208 10*3/uL (ref 150–400)
Platelets: 193 10*3/uL (ref 150–400)
RBC: 4.03 MIL/uL (ref 3.87–5.11)
RBC: 3.94 MIL/uL (ref 3.87–5.11)
RDW: 13.6 % (ref 11.5–15.5)
RDW: 13.7 % (ref 11.5–15.5)
WBC: 8.8 10*3/uL (ref 4.0–10.5)
WBC: 10 10*3/uL (ref 4.0–10.5)
nRBC: 0 % (ref 0.0–0.2)
nRBC: 0 % (ref 0.0–0.2)

## 2019-06-05 LAB — URINALYSIS, ROUTINE W REFLEX MICROSCOPIC
Bilirubin Urine: NEGATIVE
Glucose, UA: NEGATIVE mg/dL
Hgb urine dipstick: NEGATIVE
Ketones, ur: NEGATIVE mg/dL
Leukocytes,Ua: NEGATIVE
Nitrite: NEGATIVE
Protein, ur: 30 mg/dL — AB
Specific Gravity, Urine: 1.023 (ref 1.005–1.030)
pH: 6 (ref 5.0–8.0)

## 2019-06-05 LAB — COMPREHENSIVE METABOLIC PANEL
ALT: 12 U/L (ref 0–44)
AST: 20 U/L (ref 15–41)
Albumin: 2.9 g/dL — ABNORMAL LOW (ref 3.5–5.0)
Alkaline Phosphatase: 110 U/L (ref 38–126)
Anion gap: 9 (ref 5–15)
BUN: 10 mg/dL (ref 6–20)
CO2: 21 mmol/L — ABNORMAL LOW (ref 22–32)
Calcium: 9.6 mg/dL (ref 8.9–10.3)
Chloride: 106 mmol/L (ref 98–111)
Creatinine, Ser: 0.75 mg/dL (ref 0.44–1.00)
GFR calc Af Amer: >60 mL/min (ref >60)
GFR calc non Af Amer: >60 mL/min (ref >60)
Glucose, Bld: 81 mg/dL (ref 70–99)
Potassium: 3.8 mmol/L (ref 3.5–5.1)
Sodium: 136 mmol/L (ref 135–145)
Total Bilirubin: 0.1 mg/dL — ABNORMAL LOW (ref 0.3–1.2)
Total Protein: 6 g/dL — ABNORMAL LOW (ref 6.5–8.1)

## 2019-06-05 LAB — TYPE AND SCREEN
ABO/RH(D): O POS
Antibody Screen: NEGATIVE

## 2019-06-05 LAB — GLUCOSE, CAPILLARY
Glucose-Capillary: 65 mg/dL — ABNORMAL LOW (ref 70–99)
Glucose-Capillary: 73 mg/dL (ref 70–99)
Glucose-Capillary: 95 mg/dL (ref 70–99)

## 2019-06-05 LAB — ABO/RH: ABO/RH(D): O POS

## 2019-06-05 LAB — PROTEIN / CREATININE RATIO, URINE
Creatinine, Urine: 162.05 mg/dL
Protein Creatinine Ratio: 0.25 mg/mg{Cre} — ABNORMAL HIGH (ref 0.00–0.15)
Total Protein, Urine: 41 mg/dL

## 2019-06-05 MED ORDER — PHENYLEPHRINE 40 MCG/ML (10ML) SYRINGE FOR IV PUSH (FOR BLOOD PRESSURE SUPPORT): 80.0000 ug | PREFILLED_SYRINGE | INTRAVENOUS | Status: DC | PRN

## 2019-06-05 MED ORDER — TERBUTALINE SULFATE 1 MG/ML IJ SOLN: 0.2500 mg | Freq: Once | INTRAMUSCULAR | Status: DC | PRN

## 2019-06-05 MED ORDER — ACETAMINOPHEN 325 MG PO TABS: 650.0000 mg | ORAL_TABLET | ORAL | Status: DC | PRN

## 2019-06-05 MED ORDER — ACETAMINOPHEN 500 MG PO TABS
1000.0000 mg | ORAL_TABLET | Freq: Once | ORAL | Status: AC
  Administered 2019-06-05: 1000 mg via ORAL
  Filled 2019-06-05: qty 2

## 2019-06-05 MED ORDER — OXYTOCIN BOLUS FROM INFUSION
500.0000 mL | Freq: Once | INTRAVENOUS | Status: AC
  Administered 2019-06-05: 500 mL via INTRAVENOUS

## 2019-06-05 MED ORDER — FENTANYL CITRATE (PF) 100 MCG/2ML IJ SOLN
INTRAMUSCULAR | Status: AC
  Filled 2019-06-05: qty 2

## 2019-06-05 MED ORDER — OXYCODONE-ACETAMINOPHEN 5-325 MG PO TABS
2.0000 | ORAL_TABLET | ORAL | Status: DC | PRN
  Administered 2019-06-05: 2 via ORAL
  Filled 2019-06-05: qty 2

## 2019-06-05 MED ORDER — EPHEDRINE 5 MG/ML INJ: 10.0000 mg | INTRAVENOUS | Status: DC | PRN

## 2019-06-05 MED ORDER — FENTANYL CITRATE (PF) 100 MCG/2ML IJ SOLN
100.0000 ug | INTRAMUSCULAR | Status: DC | PRN
  Administered 2019-06-05: 100 ug via INTRAVENOUS

## 2019-06-05 MED ORDER — FLEET ENEMA 7-19 GM/118ML RE ENEM: 1.0000 | ENEMA | RECTAL | Status: DC | PRN

## 2019-06-05 MED ORDER — LABETALOL HCL 5 MG/ML IV SOLN
40.0000 mg | INTRAVENOUS | Status: DC | PRN
  Administered 2019-06-05: 40 mg via INTRAVENOUS
  Filled 2019-06-05: qty 8

## 2019-06-05 MED ORDER — OXYCODONE-ACETAMINOPHEN 5-325 MG PO TABS: 1.0000 | ORAL_TABLET | ORAL | Status: DC | PRN

## 2019-06-05 MED ORDER — OXYTOCIN 40 UNITS IN NORMAL SALINE INFUSION - SIMPLE MED
1.0000 m[IU]/min | INTRAVENOUS | Status: DC
  Administered 2019-06-05: 2 m[IU]/min via INTRAVENOUS
  Filled 2019-06-05: qty 1000

## 2019-06-05 MED ORDER — LABETALOL HCL 5 MG/ML IV SOLN
80.0000 mg | INTRAVENOUS | Status: DC | PRN
  Administered 2019-06-05: 80 mg via INTRAVENOUS
  Filled 2019-06-05: qty 16

## 2019-06-05 MED ORDER — LACTATED RINGERS IV SOLN
500.0000 mL | Freq: Once | INTRAVENOUS | Status: AC
  Administered 2019-06-05: 500 mL via INTRAVENOUS

## 2019-06-05 MED ORDER — HYDRALAZINE HCL 20 MG/ML IJ SOLN
5.0000 mg | Freq: Once | INTRAMUSCULAR | Status: AC
  Administered 2019-06-06: 5 mg via INTRAVENOUS
  Filled 2019-06-05: qty 1

## 2019-06-05 MED ORDER — PENICILLIN G POT IN DEXTROSE 60000 UNIT/ML IV SOLN
3.0000 10*6.[IU] | INTRAVENOUS | Status: DC
  Administered 2019-06-05: 3 10*6.[IU] via INTRAVENOUS
  Filled 2019-06-05: qty 50

## 2019-06-05 MED ORDER — SODIUM CHLORIDE 0.9 % IV SOLN
5.0000 10*6.[IU] | Freq: Once | INTRAVENOUS | Status: AC
  Administered 2019-06-05: 5 10*6.[IU] via INTRAVENOUS
  Filled 2019-06-05: qty 5

## 2019-06-05 MED ORDER — LABETALOL HCL 5 MG/ML IV SOLN
20.0000 mg | INTRAVENOUS | Status: DC | PRN
  Administered 2019-06-05: 20 mg via INTRAVENOUS
  Filled 2019-06-05: qty 4

## 2019-06-05 MED ORDER — DIPHENHYDRAMINE HCL 50 MG/ML IJ SOLN: 12.5000 mg | INTRAMUSCULAR | Status: DC | PRN

## 2019-06-05 MED ORDER — LACTATED RINGERS IV SOLN: 500.0000 mL | INTRAVENOUS | Status: DC | PRN

## 2019-06-05 MED ORDER — ONDANSETRON HCL 4 MG/2ML IJ SOLN: 4.0000 mg | Freq: Four times a day (QID) | INTRAMUSCULAR | Status: DC | PRN

## 2019-06-05 MED ORDER — MAGNESIUM SULFATE 40 GM/1000ML IV SOLN
2.0000 g/h | INTRAVENOUS | Status: DC
  Administered 2019-06-05: 2 g/h via INTRAVENOUS
  Filled 2019-06-05: qty 1000

## 2019-06-05 MED ORDER — MAGNESIUM SULFATE BOLUS VIA INFUSION
4.0000 g | Freq: Once | INTRAVENOUS | Status: AC
  Administered 2019-06-05: 4 g via INTRAVENOUS
  Filled 2019-06-05: qty 1000

## 2019-06-05 MED ORDER — FENTANYL-BUPIVACAINE-NACL 0.5-0.125-0.9 MG/250ML-% EP SOLN
12.0000 mL/h | EPIDURAL | Status: DC | PRN
  Filled 2019-06-05: qty 250

## 2019-06-05 MED ORDER — LACTATED RINGERS IV SOLN: INTRAVENOUS | Status: DC

## 2019-06-05 MED ORDER — SOD CITRATE-CITRIC ACID 500-334 MG/5ML PO SOLN: 30.0000 mL | ORAL | Status: DC | PRN

## 2019-06-05 MED ORDER — LIDOCAINE HCL (PF) 1 % IJ SOLN: 30.0000 mL | INTRAMUSCULAR | Status: DC | PRN

## 2019-06-05 MED ORDER — OXYTOCIN 40 UNITS IN NORMAL SALINE INFUSION - SIMPLE MED
2.5000 [IU]/h | INTRAVENOUS | Status: DC
  Administered 2019-06-05: 2.5 [IU]/h via INTRAVENOUS

## 2019-06-05 NOTE — Progress Notes (Signed)
Patient ID: Olivia Park, female   DOB: [birth year], 34 y.o.   MRN: [id number] Olivia Park is a 34 y.o. [id number] at [redacted]w[redacted]d admitted for induction of labor due to [facility].  Subjective: Uncomfortable w/ uc's, wants epidural, getting IV meds until CBC back. Denies ha, visual changes, ruq/epigastric pain, n/v.    Objective: BP (!) 139/98   Pulse 83   Temp 98.9 F (37.2 C) (Oral)   Resp 16   Ht 5\' 5"  (1.651 m)   Wt 110.7 kg   LMP 09/18/2018   SpO2 98% Comment: room air  BMI 40.60 kg/m  No intake/output data recorded.  FHT:  FHR: 140 bpm, variability: moderate,  accelerations:  Present,  decelerations:  Present mild variables UC:   q 2-31mins  SVE:   Dilation: 4 Effacement (%): 80 Station: -1 Exam by:: Erin Foley RN(per report given to this RN)  Pitocin @ 10 mu/min  Labs: Lab Results  Component Value Date   WBC 8.8 06/05/2019   HGB 10.8 (L) 06/05/2019   HCT 33.7 (L) 06/05/2019   MCV 83.6 06/05/2019   PLT 208 06/05/2019   CBG (last 3)  Recent Labs    06/05/19 1647 06/05/19 1706 06/05/19 2058  GLUCAP 65* 73 95     Assessment / Plan: IOL d/t GHTN, on pitocin, getting uncomfortable and requesting epidural  Labor: Progressing normally Fetal Wellbeing:  Category II Pain Control:  IV pain meds Pre-eclampsia: bp's stable, pre-e labs normal, asymptomatic I/D:  PCN for GBS+ Anticipated MOD:  NSVD  08/05/19 CNM, WHNP-BC 06/05/2019, 9:12 PM

## 2019-06-05 NOTE — Discharge Summary (Addendum)
Postpartum Discharge Summary     Patient Name: Olivia Park DOB: [birth year] MRN: [id number]  Date of admission: 06/05/2019 Delivering Provider: Paulla Dolly   Date of discharge: 06/07/2019  Admitting diagnosis: Encounter for induction of labor [Z34.90] Intrauterine pregnancy: [redacted]w[redacted]d    Secondary diagnosis:  Active Problems:   Immediate severe postpartum preeclampsia   Encounter for induction of labor  Additional problems: GHTN/severe postpartum preeclampsia, A1DM     Discharge diagnosis: Term Pregnancy Delivered, Gestational Hypertension and GDM A1, immediate pp severe pre-e (bp's)                                                                                                Post partum procedures: IV bp meds, mag x24hrs  Augmentation: Pitocin  Complications: None  Hospital course:  Induction of Labor With Vaginal Delivery   34y.o. yo G3P0202 at 372w1das admitted to the hospital 06/05/2019 for induction of labor.  Indication for induction: Gestational hypertension and A1 DM.  Patient had an uncomplicated labor course as follows: Membrane Rupture Time/Date: 7:59 PM ,06/05/2019   Intrapartum Procedures: Episiotomy: None [1]                                         Lacerations:  None [1]  Patient had delivery of a Viable infant.  Information for the patient's newborn:  HaIvelis, Norgard0[[id number]]Delivery Method: Vag-Spont    06/05/2019  Details of delivery can be found in separate delivery note.  Immediately after delivery she had severe hypertension and was started on magnesium sulfate and required 206m56m26mnd 80mg29mIV labetalol and 5mg o69mV hydralazine.  She was then started on Norvasc 10mg d37m with good response.  Patient otherwise had a routine postpartum course. Patient is discharged home 06/07/19. Delivery time: 9:52 PM    Magnesium Sulfate received: Yes, immediate pp BMZ received: No Rhophylac:N/A MMR:No Transfusion:No  Physical exam  Vitals:   06/07/19 0830 06/07/19 0859 06/07/19 1111 06/07/19 1337  BP: (!) 148/87 (!) 148/87 (!) 141/81 135/75  Pulse: 75  87 71  Resp:   18   Temp: 98.5 F (36.9 C)  97.8 F (36.6 C)   TempSrc: Oral  Axillary   SpO2:      Weight:      Height:       General: alert, cooperative and no distress Lochia: appropriate Uterine Fundus: firm Incision: N/A DVT Evaluation: No evidence of DVT seen on physical exam. Labs: Lab Results  Component Value Date   WBC 13.6 (H) 06/06/2019   HGB 10.4 (L) 06/06/2019   HCT 32.1 (L) 06/06/2019   MCV 83.8 06/06/2019   PLT 198 06/06/2019   CMP Latest Ref Rng & Units 06/05/2019  Glucose 70 - 99 mg/dL 81  BUN 6 - 20 mg/dL 10  Creatinine 0.44 - 1.00 mg/dL 0.75  Sodium 135 - 145 mmol/L 136  Potassium 3.5 - 5.1 mmol/L 3.8  Chloride 98 - 111 mmol/L 106  CO2 22 - 32 mmol/L 21(L)  Calcium 8.9 - 10.3 mg/dL 9.6  Total Protein 6.5 - 8.1 g/dL 6.0(L)  Total Bilirubin 0.3 - 1.2 mg/dL 0.1(L)  Alkaline Phos 38 - 126 U/L 110  AST 15 - 41 U/L 20  ALT 0 - 44 U/L 12   Edinburgh Score: Edinburgh Postnatal Depression Scale Screening Tool 06/07/2019  I have been able to laugh and see the funny side of things. 0  I have looked forward with enjoyment to things. 0  I have blamed myself unnecessarily when things went wrong. 0  I have been anxious or worried for no good reason. 1  I have felt scared or panicky for no good reason. 0  Things have been getting on top of me. 1  I have been so unhappy that I have had difficulty sleeping. 0  I have felt sad or miserable. 0  I have been so unhappy that I have been crying. 0  The thought of harming myself has occurred to me. 0  Edinburgh Postnatal Depression Scale Total 2    Discharge instruction: per After Visit Summary and "Baby and Me Booklet".  After visit meds:  Allergies as of 06/07/2019   No Known Allergies     Medication List    STOP taking these medications   Accu-Chek FastClix Lancets Misc   Accu-Chek Guide test  strip Generic drug: glucose blood   Accu-Chek Guide w/Device Kit   aspirin EC 81 MG tablet   promethazine 25 MG tablet Commonly known as: PHENERGAN     TAKE these medications   acetaminophen 500 MG tablet Commonly known as: TYLENOL Take 500 mg by mouth every 6 (six) hours as needed for mild pain or headache.   amLODipine 10 MG tablet Commonly known as: NORVASC Take 1 tablet (10 mg total) by mouth daily. Start taking on: June 08, 2019   Blood Pressure Monitor Devi Please check blood pressure 1-2 times per week Medicaid #[id number] What changed: Another medication with the same name was removed. Continue taking this medication, and follow the directions you see here.   ibuprofen 600 MG tablet Commonly known as: ADVIL Take 1 tablet (600 mg total) by mouth every 6 (six) hours.   norethindrone 0.35 MG tablet Commonly known as: Ortho Micronor Take 1 tablet (0.35 mg total) by mouth daily.   pantoprazole 40 MG tablet Commonly known as: Protonix Take 1 tablet (40 mg total) by mouth daily.   prenatal multivitamin Tabs tablet Take 1 tablet by mouth daily at 12 noon.       Diet: routine diet  Activity: Advance as tolerated. Pelvic rest for 6 weeks.   Outpatient follow up:1 week postpartum blood pressure check Follow up Appt: Future Appointments  Date Time Provider Department Center  06/12/2019  8:15 AM [facility] NURSE [facility]  07/10/2019  9:30 AM Pratt, Tanya S, MD [facility]   Follow up Visit: Booker, Kimberly R, CNM  [facility] Support Pool  Please schedule this patient for PP visit in: 1 week bp check, 4-6wk pp visit  High risk pregnancy complicated by: GHTN, A1DM  Delivery mode: SVD  Anticipated Birth Control: POPs, will plan to change to Nexplanon when she feels comfortable with breastfeeding PP Procedures needed: 2hr GTT, bp check  Schedule Integrated BH visit: no  Provider: Any provider    Newborn Data: Live born female    Birth Weight:   APGAR: ,   Newborn Delivery   Birth date/time: 06/05/2019 21:52:00   Delivery type: Vaginal, Spontaneous      Baby Feeding: Breast Disposition:home with mother   06/07/2019 Laura Pezzuto, MD   

## 2019-06-05 NOTE — H&P (Addendum)
OBSTETRIC ADMISSION HISTORY AND PHYSICAL  Olivia Park is a 35 y.o. female [phone] with IUP at 42w1dby LMP presenting for high BP. She reports +FMs, resolved headache. No LOF, no VB, no blurry vision, or peripheral edema, and RUQ pain.  She plans on breast feeding. She request Nexplanon for birth control. [facility]  Dating: By LMP --->  Estimated Date of Delivery: 06/25/19  Sono:    '@[redacted]w[redacted]d'$ , CWD, normal anatomy, cephalic presentation, 24174g, 6 lbs 2 oz, 66% EFW   Prenatal History/Complications:  [id number] of gHTN in previous pregnancy Hx of preterm delivery Short interval between pregnancies UTI in pregnancy GBS carrier Obesity in pregnancy  Past Medical History: Past Medical History:  Diagnosis Date  . Gestational diabetes   . Pregnancy induced hypertension    Developed Pre-e  . Vaginal Pap smear, abnormal     Past Surgical History: Past Surgical History:  Procedure Laterality Date  . NO PAST SURGERIES      Obstetrical History: OB History    Gravida  3   Para  2   Term  0   Preterm  2   AB      Living  2     SAB      TAB      Ectopic      Multiple  0   Live Births  2           Social History Social History   Socioeconomic History  . Marital status: Single    Spouse name: Not on file  . Number of children: Not on file  . Years of education: Not on file  . Highest education level: Not on file  Occupational History    Comment: Unemployed  Tobacco Use  . Smoking status: Never Smoker  . Smokeless tobacco: Never Used  Substance and Sexual Activity  . Alcohol use: No    Comment: socially  . Drug use: No  . Sexual activity: Yes    Birth control/protection: None  Other Topics Concern  . Not on file  Social History Narrative  . Not on file   Social Determinants of Health   Financial Resource Strain:   . Difficulty of Paying Living Expenses: Not on file  Food Insecurity: No Food Insecurity   . Worried About RCharity fundraiserin the Last Year: Never true  . Ran Out of Food in the Last Year: Never true  Transportation Needs: No Transportation Needs  . Lack of Transportation (Medical): No  . Lack of Transportation (Non-Medical): No  Physical Activity:   . Days of Exercise per Week: Not on file  . Minutes of Exercise per Session: Not on file  Stress:   . Feeling of Stress : Not on file  Social Connections:   . Frequency of Communication with Friends and Family: Not on file  . Frequency of Social Gatherings with Friends and Family: Not on file  . Attends Religious Services: Not on file  . Active Member of Clubs or Organizations: Not on file  . Attends CArchivistMeetings: Not on file  . Marital Status: Not on file    Family History: Family History  Problem Relation Age of Onset  . Hypertension Mother   . Diabetes Mother   . Heart disease Mother   . Hypertension Father     Allergies: No Known Allergies  Medications Prior to Admission  Medication Sig Dispense Refill Last Dose  .  Accu-Chek FastClix Lancets MISC 1 Units by Percutaneous route 4 (four) times daily. 100 each 12   . aspirin EC 81 MG tablet Take 1 tablet (81 mg total) by mouth daily. Take after 12 weeks for prevention of preeclampsia later in pregnancy 300 tablet 2   . Blood Glucose Monitoring Suppl (ACCU-CHEK GUIDE) w/Device KIT 1 Device by Does not apply route 4 (four) times daily. 1 kit 0   . Blood Pressure Monitor DEVI Please check blood pressure 1-2 times per week Medicaid #[id number] P 1 each 1   . Blood Pressure Monitoring (BLOOD PRESSURE KIT) DEVI 1 Device by Does not apply route as needed. ICD 10 Z34.90 1 Device 0   . glucose blood (ACCU-CHEK GUIDE) test strip Use to check blood sugars four times a day was instructed 50 each 12   . pantoprazole (PROTONIX) 40 MG tablet Take 1 tablet (40 mg total) by mouth daily. 30 tablet 2   . Prenatal MV-Min-FA-Omega-3 (PRENATAL GUMMIES/DHA & FA PO) Take 2  tablets by mouth daily.     . promethazine (PHENERGAN) 25 MG tablet Take 1 tablet (25 mg total) by mouth every 6 (six) hours as needed for nausea or vomiting. 30 tablet 2      Review of Systems   All systems reviewed and negative except as stated in HPI  Blood pressure (!) 147/82, pulse 69, temperature 97.9 F (36.6 C), temperature source Oral, resp. rate 16, last menstrual period 09/18/2018, SpO2 98 %, currently breastfeeding. General appearance: alert, cooperative and no distress Lungs: clear to auscultation bilaterally Heart: regular rate and rhythm Abdomen: soft, non-tender; bowel sounds normal Pelvic: per RN Extremities: Homans sign is negative, no sign of DVT Presentation: cephalic Fetal monitoringBaseline: 135 bpm, Variability: Good {> 6 bpm) and Accelerations: Reactive Uterine activity: irregular     Prenatal labs: ABO, Rh: O/Positive/-- (09/16 1158) Antibody: Negative (09/16 1158) Rubella: 1.60 (09/16 1158) RPR: Non Reactive (01/06 0840)  HBsAg: Negative (09/16 1158)  HIV: Non Reactive (01/06 0840)  GBS: --Tessie Fass (02/25 1149)  2 hr Glucola abnormal Genetic screening  Normal  Anatomy US normal  Prenatal Transfer Tool  Maternal Diabetes: Yes:  Diabetes Type:  Diet controlled Genetic Screening: Normal Maternal Ultrasounds/Referrals: Normal Fetal Ultrasounds or other Referrals:  Referred to Materal Fetal Medicine  Maternal Substance Abuse:  No Significant Maternal Medications:  None Significant Maternal Lab Results: Group B Strep positive  Results for orders placed or performed during the hospital encounter of 06/05/19 (from the past 24 hour(s))  CBC   Collection Time: 06/05/19  1:50 PM  Result Value Ref Range   WBC 8.8 4.0 - 10.5 K/uL   RBC 4.03 3.87 - 5.11 MIL/uL   Hemoglobin 10.8 (L) 12.0 - 15.0 g/dL   HCT 33.7 (L) 36.0 - 46.0 %   MCV 83.6 80.0 - 100.0 fL   MCH 26.8 26.0 - 34.0 pg   MCHC 32.0 30.0 - 36.0 g/dL   RDW 13.6 11.5 - 15.5 %   Platelets 208  150 - 400 K/uL   nRBC 0.0 0.0 - 0.2 %  Comprehensive metabolic panel   Collection Time: 06/05/19  1:50 PM  Result Value Ref Range   Sodium 136 135 - 145 mmol/L   Potassium 3.8 3.5 - 5.1 mmol/L   Chloride 106 98 - 111 mmol/L   CO2 21 (L) 22 - 32 mmol/L   Glucose, Bld 81 70 - 99 mg/dL   BUN 10 6 - 20 mg/dL   Creatinine, Ser 0.75 0.44 -  1.00 mg/dL   Calcium 9.6 8.9 - 10.3 mg/dL   Total Protein 6.0 (L) 6.5 - 8.1 g/dL   Albumin 2.9 (L) 3.5 - 5.0 g/dL   AST 20 15 - 41 U/L   ALT 12 0 - 44 U/L   Alkaline Phosphatase 110 38 - 126 U/L   Total Bilirubin 0.1 (L) 0.3 - 1.2 mg/dL   GFR calc non Af Amer >60 >60 mL/min   GFR calc Af Amer >60 >60 mL/min   Anion gap 9 5 - 15  Protein / creatinine ratio, urine   Collection Time: 06/05/19  2:00 PM  Result Value Ref Range   Creatinine, Urine 162.05 mg/dL   Total Protein, Urine 41 mg/dL   Protein Creatinine Ratio 0.25 (H) 0.00 - 0.15 mg/mg[Cre]  Urinalysis, Routine w reflex microscopic   Collection Time: 06/05/19  2:00 PM  Result Value Ref Range   Color, Urine YELLOW YELLOW   APPearance HAZY (A) CLEAR   Specific Gravity, Urine 1.023 1.005 - 1.030   pH 6.0 5.0 - 8.0   Glucose, UA NEGATIVE NEGATIVE mg/dL   Hgb urine dipstick NEGATIVE NEGATIVE   Bilirubin Urine NEGATIVE NEGATIVE   Ketones, ur NEGATIVE NEGATIVE mg/dL   Protein, ur 30 (A) NEGATIVE mg/dL   Nitrite NEGATIVE NEGATIVE   Leukocytes,Ua NEGATIVE NEGATIVE   RBC / HPF 0-5 0 - 5 RBC/hpf   WBC, UA 0-5 0 - 5 WBC/hpf   Bacteria, UA RARE (A) NONE SEEN   Squamous Epithelial / LPF 6-10 0 - 5   Mucus PRESENT     Patient Active Problem List   Diagnosis Date Noted  . BMI 40.0-44.9, adult ([place]) 05/29/2019  . Obesity in pregnancy 05/29/2019  . Group B Streptococcus carrier, +RV culture, currently pregnant 05/28/2019  . Gestational diabetes mellitus (GDM) in third trimester 04/05/2019  . UTI (urinary tract infection) during pregnancy 12/18/2018  . Short interval between pregnancies affecting  pregnancy, antepartum 11/29/2018  . Supervision of high risk pregnancy, antepartum 11/28/2018  . History of preterm delivery, currently pregnant 11/28/2018  . History of gestational hypertension 10/24/2017    Assessment/Plan:  Olivia Park is a 34 y.o. [id number] at 65w1dhere for gHTN.  #Labor: IOL   #Pain: Analgesia, anesthesia prn  #FWB: Category 1  #ID:  GBS pos #MOF: Breast #MOC: Nexplanon postpartum #Circ:  Outpatient  a1GDM: check blood sugars q4  ABruna Potter Medical Student  06/05/2019, 3:28 PM  Midwife attestation: I have seen and examined this patient; I agree with above documentation in the student's note.   PE: Gen: calm comfortable, NAD Resp: normal effort and rate Abd: gravid  ROS, labs, PMH reviewed  Assessment/Plan: Olivia MITTMANis a 34y.o. [phone] for IOL for gHTN Admit to LD Labor: IOL FWB: Cat I GBS pos Favorable cervix Start Pitocin  MJulianne Handler CNM  06/05/2019, 6:45 PM

## 2019-06-05 NOTE — MAU Note (Signed)
Patient states she was in the office for an ultrasound to check her fluid and they told her she needed to come over to be evaluated because her blood pressure was "really high". Reports bad headaches for the last two weeks on and off that sometimes make her throw up or have to go to sleep. Denies visual changes/RUQ pain/swelling. Reports good fetal movement. Denies LOF/VB. Currently has a headache that she rates a 7/10.

## 2019-06-05 NOTE — Anesthesia Preprocedure Evaluation (Deleted)
Anesthesia Evaluation  Patient identified by MRN, date of birth, ID band Patient awake    Reviewed: Allergy & Precautions, NPO status , Patient's Chart, lab work & pertinent test results  History of Anesthesia Complications Negative for: history of anesthetic complications  Airway Mallampati: II   Neck ROM: Full    Dental   Pulmonary neg pulmonary ROS,    Pulmonary exam normal        Cardiovascular hypertension (PIH/Pre-E), Normal cardiovascular exam     Neuro/Psych negative neurological ROS  negative psych ROS   GI/Hepatic negative GI ROS, Neg liver ROS,   Endo/Other  diabetes, GestationalMorbid obesity  Renal/GU negative Renal ROS     Musculoskeletal negative musculoskeletal ROS (+)   Abdominal (+) + obese,   Peds  Hematology  (+) anemia ,  Plt 193k    Anesthesia Other Findings Covid test in process   Reproductive/Obstetrics (+) Pregnancy                             Anesthesia Physical Anesthesia Plan  ASA: III  Anesthesia Plan: Epidural   Post-op Pain Management:    Induction:   PONV Risk Score and Plan: 2 and Treatment may vary due to age or medical condition  Airway Management Planned: Natural Airway  Additional Equipment: None  Intra-op Plan:   Post-operative Plan:   Informed Consent: I have reviewed the patients History and Physical, chart, labs and discussed the procedure including the risks, benefits and alternatives for the proposed anesthesia with the patient or authorized representative who has indicated his/her understanding and acceptance.       Plan Discussed with: Anesthesiologist  Anesthesia Plan Comments: (Labs reviewed. Platelets acceptable, patient not taking any blood thinning medications. Per RN, FHR tracing reported to be stable enough for sitting procedure. Risks and benefits discussed with patient, including PDPH, backache, epidural hematoma,  failed epidural, blood pressure changes, allergic reaction, and nerve injury. Patient expressed understanding and wished to proceed.)        Anesthesia Quick Evaluation

## 2019-06-05 NOTE — MAU Provider Note (Signed)
Chief Complaint  Patient presents with  . Hypertension     First Provider Initiated Contact with Patient 06/05/19 1402      S: Olivia Park  is a 34 y.o. y.o. year old G3P0202 female at [redacted]w[redacted]d weeks gestation who presents to MAU with elevated blood pressures. She was sent from MFM after ultrasound. BP in MFM around 1130 were 142/97 then 156/95. She reports hx of PEC in previous pregnancy. This pregnancy is complicated by Y7CWC.   Patient reports having an HA on/off for the past two weeks that usually goes away. Patient reports current HA that started this morning around 1000 but has not gone away, rates HA 6/10- has not taken any medication for it. Denies vision changes, RUQ pain or epigastric pain.   She reports occasional BH contractions, denies vaginal bleeding or LOF. +FM.   O:  Patient Vitals for the past 24 hrs:  BP Temp Temp src Pulse Resp SpO2  06/05/19 1517 (!) 147/82 -- -- 69 -- --  06/05/19 1500 (!) 146/77 -- -- 71 -- --  06/05/19 1447 (!) 142/82 -- -- 72 -- --  06/05/19 1432 (!) 141/76 -- -- 75 -- --  06/05/19 1419 (!) 146/86 -- -- 80 -- --  06/05/19 1402 138/72 -- -- 74 -- --  06/05/19 1343 (!) 152/93 97.9 F (36.6 C) Oral 74 16 98 %   General: NAD Heart: Regular rate Lungs: Normal rate and effort Abd: Soft, NT, Gravid, S=D Extremities: +1 Pedal edema Neuro: 2+ deep tendon reflexes, No clonus    FHR: 140/moderate/+accels/ no decel TOCO: UI  Results for orders placed or performed during the hospital encounter of 06/05/19 (from the past 24 hour(s))  CBC     Status: Abnormal   Collection Time: 06/05/19  1:50 PM  Result Value Ref Range   WBC 8.8 4.0 - 10.5 K/uL   RBC 4.03 3.87 - 5.11 MIL/uL   Hemoglobin 10.8 (L) 12.0 - 15.0 g/dL   HCT 33.7 (L) 36.0 - 46.0 %   MCV 83.6 80.0 - 100.0 fL   MCH 26.8 26.0 - 34.0 pg   MCHC 32.0 30.0 - 36.0 g/dL   RDW 13.6 11.5 - 15.5 %   Platelets 208 150 - 400 K/uL   nRBC 0.0 0.0 - 0.2 %  Comprehensive metabolic panel      Status: Abnormal   Collection Time: 06/05/19  1:50 PM  Result Value Ref Range   Sodium 136 135 - 145 mmol/L   Potassium 3.8 3.5 - 5.1 mmol/L   Chloride 106 98 - 111 mmol/L   CO2 21 (L) 22 - 32 mmol/L   Glucose, Bld 81 70 - 99 mg/dL   BUN 10 6 - 20 mg/dL   Creatinine, Ser 0.75 0.44 - 1.00 mg/dL   Calcium 9.6 8.9 - 10.3 mg/dL   Total Protein 6.0 (L) 6.5 - 8.1 g/dL   Albumin 2.9 (L) 3.5 - 5.0 g/dL   AST 20 15 - 41 U/L   ALT 12 0 - 44 U/L   Alkaline Phosphatase 110 38 - 126 U/L   Total Bilirubin 0.1 (L) 0.3 - 1.2 mg/dL   GFR calc non Af Amer >60 >60 mL/min   GFR calc Af Amer >60 >60 mL/min   Anion gap 9 5 - 15  Protein / creatinine ratio, urine     Status: Abnormal   Collection Time: 06/05/19  2:00 PM  Result Value Ref Range   Creatinine, Urine 162.05 mg/dL  Total Protein, Urine 41 mg/dL   Protein Creatinine Ratio 0.25 (H) 0.00 - 0.15 mg/mg[Cre]  Urinalysis, Routine w reflex microscopic     Status: Abnormal   Collection Time: 06/05/19  2:00 PM  Result Value Ref Range   Color, Urine YELLOW YELLOW   APPearance HAZY (A) CLEAR   Specific Gravity, Urine 1.023 1.005 - 1.030   pH 6.0 5.0 - 8.0   Glucose, UA NEGATIVE NEGATIVE mg/dL   Hgb urine dipstick NEGATIVE NEGATIVE   Bilirubin Urine NEGATIVE NEGATIVE   Ketones, ur NEGATIVE NEGATIVE mg/dL   Protein, ur 30 (A) NEGATIVE mg/dL   Nitrite NEGATIVE NEGATIVE   Leukocytes,Ua NEGATIVE NEGATIVE   RBC / HPF 0-5 0 - 5 RBC/hpf   WBC, UA 0-5 0 - 5 WBC/hpf   Bacteria, UA RARE (A) NONE SEEN   Squamous Epithelial / LPF 6-10 0 - 5   Mucus PRESENT    Treatments in MAU included Tylenol 1,000mg  for HA.  Reassessment patient reports HA is resolved  Labs result reviewed: PEC labs negative   A: [redacted]w[redacted]d week IUP Gestational hypertension FHR reactive  P: Admit to labor and delivery  Care taken over by labor team  Report given to labor team  Discussed plan of care with patient and patient is agreeable to IOL for Casilda Carls,  CNM 06/05/2019 3:30 PM

## 2019-06-06 ENCOUNTER — Encounter: Payer: Medicaid Other | Admitting: Family Medicine

## 2019-06-06 ENCOUNTER — Encounter (HOSPITAL_COMMUNITY): Payer: Self-pay | Admitting: Obstetrics and Gynecology

## 2019-06-06 LAB — CBC
HCT: 32.1 % — ABNORMAL LOW (ref 36.0–46.0)
Hemoglobin: 10.4 g/dL — ABNORMAL LOW (ref 12.0–15.0)
MCH: 27.2 pg (ref 26.0–34.0)
MCHC: 32.4 g/dL (ref 30.0–36.0)
MCV: 83.8 fL (ref 80.0–100.0)
Platelets: 198 10*3/uL (ref 150–400)
RBC: 3.83 MIL/uL — ABNORMAL LOW (ref 3.87–5.11)
RDW: 13.7 % (ref 11.5–15.5)
WBC: 13.6 10*3/uL — ABNORMAL HIGH (ref 4.0–10.5)
nRBC: 0 % (ref 0.0–0.2)

## 2019-06-06 LAB — SARS CORONAVIRUS 2 (TAT 6-24 HRS): SARS Coronavirus 2: NEGATIVE

## 2019-06-06 LAB — RPR: RPR Ser Ql: NONREACTIVE

## 2019-06-06 MED ORDER — BENZOCAINE-MENTHOL 20-0.5 % EX AERO: 1.0000 "application " | INHALATION_SPRAY | CUTANEOUS | Status: DC | PRN

## 2019-06-06 MED ORDER — TETANUS-DIPHTH-ACELL PERTUSSIS 5-2.5-18.5 LF-MCG/0.5 IM SUSP: 0.5000 mL | Freq: Once | INTRAMUSCULAR | Status: DC

## 2019-06-06 MED ORDER — ONDANSETRON HCL 4 MG PO TABS: 4.0000 mg | ORAL_TABLET | ORAL | Status: DC | PRN

## 2019-06-06 MED ORDER — WITCH HAZEL-GLYCERIN EX PADS: 1.0000 "application " | MEDICATED_PAD | CUTANEOUS | Status: DC | PRN

## 2019-06-06 MED ORDER — IBUPROFEN 600 MG PO TABS
600.0000 mg | ORAL_TABLET | Freq: Four times a day (QID) | ORAL | Status: DC
  Administered 2019-06-06 – 2019-06-07 (×7): 600 mg via ORAL
  Filled 2019-06-06 (×7): qty 1

## 2019-06-06 MED ORDER — OXYTOCIN 40 UNITS IN NORMAL SALINE INFUSION - SIMPLE MED
2.5000 [IU]/h | INTRAVENOUS | Status: DC | PRN
  Administered 2019-06-06: 2.5 [IU]/h via INTRAVENOUS

## 2019-06-06 MED ORDER — DIPHENHYDRAMINE HCL 25 MG PO CAPS: 25.0000 mg | ORAL_CAPSULE | Freq: Four times a day (QID) | ORAL | Status: DC | PRN

## 2019-06-06 MED ORDER — ACETAMINOPHEN 325 MG PO TABS: 650.0000 mg | ORAL_TABLET | ORAL | Status: DC | PRN

## 2019-06-06 MED ORDER — METHYLERGONOVINE MALEATE 0.2 MG/ML IJ SOLN: 0.2000 mg | INTRAMUSCULAR | Status: DC | PRN

## 2019-06-06 MED ORDER — DIBUCAINE (PERIANAL) 1 % EX OINT: 1.0000 "application " | TOPICAL_OINTMENT | CUTANEOUS | Status: DC | PRN

## 2019-06-06 MED ORDER — PRENATAL MULTIVITAMIN CH
1.0000 | ORAL_TABLET | Freq: Every day | ORAL | Status: DC
  Administered 2019-06-06 – 2019-06-07 (×2): 1 via ORAL
  Filled 2019-06-06 (×2): qty 1

## 2019-06-06 MED ORDER — METHYLERGONOVINE MALEATE 0.2 MG PO TABS: 0.2000 mg | ORAL_TABLET | ORAL | Status: DC | PRN

## 2019-06-06 MED ORDER — COCONUT OIL OIL: 1.0000 "application " | TOPICAL_OIL | Status: DC | PRN

## 2019-06-06 MED ORDER — ONDANSETRON HCL 4 MG/2ML IJ SOLN: 4.0000 mg | INTRAMUSCULAR | Status: DC | PRN

## 2019-06-06 MED ORDER — MAGNESIUM SULFATE 40 GM/1000ML IV SOLN
2.0000 g/h | INTRAVENOUS | Status: AC
  Administered 2019-06-06 (×2): 2 g/h via INTRAVENOUS
  Filled 2019-06-06: qty 1000

## 2019-06-06 MED ORDER — OXYCODONE HCL 5 MG PO TABS: 5.0000 mg | ORAL_TABLET | ORAL | Status: DC | PRN

## 2019-06-06 MED ORDER — AMLODIPINE BESYLATE 10 MG PO TABS
10.0000 mg | ORAL_TABLET | Freq: Every day | ORAL | Status: DC
  Administered 2019-06-06 – 2019-06-07 (×2): 10 mg via ORAL
  Filled 2019-06-06 (×2): qty 1

## 2019-06-06 MED ORDER — OXYCODONE HCL 5 MG PO TABS: 10.0000 mg | ORAL_TABLET | ORAL | Status: DC | PRN

## 2019-06-06 MED ORDER — LACTATED RINGERS IV SOLN: INTRAVENOUS | Status: DC

## 2019-06-06 MED ORDER — SIMETHICONE 80 MG PO CHEW: 80.0000 mg | CHEWABLE_TABLET | ORAL | Status: DC | PRN

## 2019-06-06 NOTE — Lactation Note (Signed)
This note was copied from a baby's chart. Lactation Consultation Note  Patient Name: Olivia Park [id number] Date: 06/06/2019   Initial visit at 11 hours of life.  [facility] student entered room to Mom up in bed and baby Olivia asleep in his bassinet. Mom has two older children who were both preterm, neither required NICU or special care services and both were breastfed, her last for 6 months. Mom reports that baby Olivia has been nursing well at the breast, and has stooled and voided this morning. [facility] student explained early term development and explained that lactation services would like to observe a feed, and asked her to call for lactation when baby is next showing signs that he is hungry.   LC student encouraged Mom to use hand expression to stimulate her breasts. [facility] student taught hand expression and assisted Mom in her technique. [facility] student provided Mom with a colostrum collecting vial with LC approval.   Mom is on amlodipine 10mg  which is an L3 per Dr .   Mom was shown the breastfeeding services brochure and encourages to call lactation services once she is home for any assistance.   Consult Status      Sheffield Slider Adan Baehr 06/06/2019, 9:37 AM

## 2019-06-06 NOTE — Progress Notes (Signed)
RN discussed with patient the need for a support person to be with her while she is receiving the Mag treatment. She explained her mother was her support person, however, is currently taking care of her other child. Patient was encouraged to notify RN or nurse tech whenever assistance is needed. RN discussed and educated patient of baby safety. Patient verbally acknowledged understanding.

## 2019-06-06 NOTE — Lactation Note (Signed)
This note was copied from a baby's chart. Lactation Consultation Note  Patient Name: Olivia Park Date: 06/06/2019 Reason for consult: Initial assessment  Infant is now 72 hrs old. Infant was recently bathed & Mom was interested in nursing again. Mom was willing to try a side-lying position. Infant latched with relative ease & was nursing well (swallows verified by cervical auscultation--sometimes infant's suck: swallow ratio was 1:1).    At this time, I do not think it is necessary to have Mom start pumping (she nursed her previous children, [born at 42 & 36 weeks] without difficulty for 6 months & 5 months, respectively). Mom said she stopped nursing her last child at 5 months b/c her milk supply dropped after having received Depo-Provera.   I did instruct Mom to let us know if she noticed infant not feeding well or seeming too tired to nurse so that we could help her express her milk. [facility] student had reviewed hand expression with her & Mom verbalized that she intended to do hand expression. Mom requested extra colostrum vials, which were provided.    Lurline Hare [facility] 06/06/2019, 11:21 AM

## 2019-06-06 NOTE — Progress Notes (Signed)
Post Partum Day 1 Subjective: no complaints, tolerating PO and breastfeeding well.  Tolerating magnesium and no headache, visual changes, chest pain, shortness of breath, right upper quadrant pain, or other concerns.  Feels overall well.  Objective: Blood pressure (!) 146/85, pulse 79, temperature 98.4 F (36.9 C), temperature source Oral, resp. rate 18, height 5\' 5"  (1.651 m), weight 110.7 kg, last menstrual period 09/18/2018, SpO2 99 %, unknown if currently breastfeeding.  Physical Exam:  General: alert, cooperative, appears stated age and no distress Lochia: appropriate Uterine Fundus: firm DVT Evaluation: No evidence of DVT seen on physical exam. Normal patellar reflexes  Recent Labs    06/05/19 2052 06/06/19 0625  HGB 10.7* 10.4*  HCT 33.1* 32.1*    Intake/Output Summary (Last 24 hours) at 06/06/2019 0957 Last data filed at 06/06/2019 0900 Gross per 24 hour  Intake 741.9 ml  Output 2583 ml  Net -1841.1 ml   --/--/O POS, O POS Performed at [facility], [address] [place], [place]  [phone])   Assessment/Plan:  34 y.o. 34 on PPD#1 s/p NSVD at [redacted]w[redacted]d following pitocin IOL for gHTN and A1GDM -- gHTN with severe hypertension postpartum: complete magnesium 24 hours postpartum.  Pressures currently controlled on Norvasc 10mg  daily.  Asymptomatic.  Continue to monitor.  Consider discharge tomorrow if remains well. -- Breastfeeding, plans Nexplanon, Rh positive, Rubella immune -- A1GDM: accuchecks wnl, discontinued, follow up at postpartum visit -- Routine postpartum care.   LOS: 1 day   [redacted]w[redacted]d 06/06/2019, 9:56 AM

## 2019-06-07 MED ORDER — IBUPROFEN 600 MG PO TABS: 600.0000 mg | ORAL_TABLET | Freq: Four times a day (QID) | ORAL | 0 refills | Status: DC

## 2019-06-07 MED ORDER — AMLODIPINE BESYLATE 10 MG PO TABS: 10.0000 mg | ORAL_TABLET | Freq: Every day | ORAL | 2 refills | Status: DC

## 2019-06-07 MED ORDER — NORETHINDRONE 0.35 MG PO TABS: 1.0000 | ORAL_TABLET | Freq: Every day | ORAL | 11 refills | Status: DC

## 2019-06-07 MED FILL — AMLODIPINE BESYLATE 10 MG T: 10 | 30 days supply | Qty: 30 | Fill #0

## 2019-06-07 MED FILL — IBUPROFEN 600 MG TABS: 600 | 7 days supply | Qty: 30 | Fill #0

## 2019-06-07 MED FILL — NORETHINDRONE 0.35 MG TAB: 0.35 | 28 days supply | Qty: 28 | Fill #0

## 2019-06-07 NOTE — Lactation Note (Addendum)
This note was copied from a baby's chart. Lactation Consultation Note  Patient Name: Olivia Park FIEPP'I Date: 06/07/2019 Reason for consult: Follow-up assessment;Early term 37-38.6wks;Infant weight loss;Other (Comment)(mom post MagSo4 and on B/P meds - see LC note) Baby is 28 hours old - baby sound asleep , recently fed .  Per mom I really want to breast feed and breast feeding is going well.  LC reviewed supply and demand / importance of giving the baby lots of  Practice at the breast. LC recommended if the baby has a feeding where  He is wide awake and feeds well on the 1st breast / offer the 2nd breast.  If the feeding was sluggish , feed on the 1st breast and offer the supplement.  pe rmom has been in contact with [facility] since she delivered. LC recommended  Calling them back this am and share with them the list of reasons why a pump is needed. ( B/P still elevated, B/P meds, ET infant)  Per mom breast are fuller and nipples more sensitive, sore nipple and engorgement  Prevention and tx reviewed. LC instructed mom on the use of the hand pump and provided a DEBP kit for the [name] pump, and storage of breast milk page 36 of the infant and mother care booklet. S/S of mastitis reviewed.  Per mom the [facility] office is the [facility] center. LC recommended when there tomorrow to schedule appointment  for next week with [facility] .  Mom has the [facility] pamphlet with phone numbers.   Maternal Data    Feeding Feeding Type: (per mom breast fed 20 mins around 8 30 and the RN mentioned she was going to document)  LATCH Score                   Interventions Interventions: Breast feeding basics reviewed  Lactation Tools Discussed/Used Tools: Pump;Flanges Flange Size: 24;27 Breast pump type: Double-Electric Breast Pump;Manual WIC Program: Yes(LC recommended to call [place] [facility] back this am for a DEBP today) Pump Review: Milk Storage;Setup, frequency, and cleaning Initiated by:: MAI Date  initiated:: 06/07/19   Consult Status Consult Status: Follow-up(LC recommended checking at the [facility] office for F/U appt in 1 week for check on MS) Follow-up type: [place] 06/07/2019, 9:33 AM

## 2019-06-07 NOTE — Progress Notes (Signed)
Dr Jeanann Lewandowsky is notified of BP 160/ 91 and repeated BP 148/87 after BP cuff and pt are repositioned. Pt reports no headache, no blurred vision no epigastic pain. The DTR's are +2 bilat, no clonus noted. Plan to give Norvasc 10 mg at 0900. No new orders are received.

## 2019-06-13 ENCOUNTER — Other Ambulatory Visit: Payer: Self-pay

## 2019-06-13 ENCOUNTER — Ambulatory Visit (INDEPENDENT_AMBULATORY_CARE_PROVIDER_SITE_OTHER): Payer: Medicaid Other | Admitting: *Deleted

## 2019-06-13 VITALS — BP 132/88 | HR 76

## 2019-06-13 DIAGNOSIS — O24410 Gestational diabetes mellitus in pregnancy, diet controlled: Secondary | ICD-10-CM

## 2019-06-13 NOTE — Progress Notes (Signed)
Patient seen and assessed by nursing staff during this encounter. I have reviewed the chart and agree with the documentation and plan.  Kerim Statzer, MD 06/13/2019 2:52 PM   

## 2019-06-13 NOTE — Progress Notes (Signed)
Subjective:  Olivia Park is a 34 y.o. female here for BP check.   Hypertension ROS: taking medications as instructed, no medication side effects noted, no TIA's, no chest pain on exertion, no dyspnea on exertion and no swelling of ankles.    Objective:  BP 132/88   Pulse 76   LMP 09/18/2018   Appearance alert, well appearing, and in no distress. General exam BP noted to be well controlled today in office.    Assessment:   Blood Pressure well controlled.   Plan:  Follow up: 5 weeks and as needed.Marland Kitchen

## 2019-06-25 ENCOUNTER — Inpatient Hospital Stay (HOSPITAL_COMMUNITY): Admission: RE | Admit: 2019-06-25 | Payer: Medicaid Other | Source: Home / Self Care

## 2019-07-10 ENCOUNTER — Ambulatory Visit: Payer: Medicaid Other | Admitting: Family Medicine

## 2019-07-10 ENCOUNTER — Encounter: Payer: Self-pay | Admitting: Family Medicine

## 2019-07-10 NOTE — Progress Notes (Signed)
Patient did not keep appointment today. She will be called to reschedule.  

## 2019-07-26 ENCOUNTER — Other Ambulatory Visit: Payer: Self-pay

## 2019-07-26 ENCOUNTER — Ambulatory Visit (INDEPENDENT_AMBULATORY_CARE_PROVIDER_SITE_OTHER): Payer: Medicaid Other | Admitting: Obstetrics and Gynecology

## 2019-07-26 VITALS — BP 144/97 | HR 61 | Wt 221.0 lb

## 2019-07-26 DIAGNOSIS — O24410 Gestational diabetes mellitus in pregnancy, diet controlled: Secondary | ICD-10-CM

## 2019-07-26 DIAGNOSIS — O1413 Severe pre-eclampsia, third trimester: Secondary | ICD-10-CM

## 2019-07-26 DIAGNOSIS — Z8759 Personal history of other complications of pregnancy, childbirth and the puerperium: Secondary | ICD-10-CM

## 2019-07-26 DIAGNOSIS — Z3202 Encounter for pregnancy test, result negative: Secondary | ICD-10-CM

## 2019-07-26 DIAGNOSIS — O99210 Obesity complicating pregnancy, unspecified trimester: Secondary | ICD-10-CM

## 2019-07-26 DIAGNOSIS — Z6841 Body Mass Index (BMI) 40.0 and over, adult: Secondary | ICD-10-CM

## 2019-07-26 LAB — POCT URINE PREGNANCY: Preg Test, Ur: NEGATIVE

## 2019-07-26 MED ORDER — MAGNESIUM MALATE 1250 (141.7 MG) MG PO TABS: 1.0000 | ORAL_TABLET | Freq: Every day | ORAL | 0 refills | Status: DC | PRN

## 2019-07-26 MED ORDER — AMLODIPINE BESYLATE 10 MG PO TABS: 10.0000 mg | ORAL_TABLET | Freq: Every day | ORAL | 1 refills | Status: DC

## 2019-07-26 NOTE — Progress Notes (Signed)
    Post Partum Visit Note  Olivia Park is a 34 y.o. [phone] who presents for a postpartum visit.  She is s/p 3/9 SVD/intact perineum. Peripartum course c/b IOL for gHTN and then dx with severe pre-x immediately PP @ 37wks.     I have fully reviewed the prenatal and intrapartum course.  Anesthesia: none. Postpartum course has been uncomplicated. Baby is doing well. Baby is feeding by both breast and bottle - Enfamil with Iron. Bleeding no bleeding. Bowel function is normal. Bladder function is normal. Patient is not sexually active. Contraception method is undecided. Postpartum depression screening: negative.  Review of Systems Pertinent items noted in HPI and remainder of comprehensive ROS otherwise negative.    Objective:  Blood pressure (!) 144/97, pulse 61, weight 221 lb (100.2 kg), unknown if currently breastfeeding. NAD  Assessment:   Pt doing well  Plan:   Essential components of care per ACOG recommendations:  1.  Mood and well being: No issues. EPDS score zero  2. Infant care and feeding: pt is breast and formula feeding.  3. Sexuality, contraception and birth spacing: no sex yet. Pt undecided on BC and going between depo or nexplanon, both of which she has used before. D/w her that can do today while she's doing her 2h GTT at anytime or call us anytime to do.   4. Sleep and fatigue: works nights.   5. Physical Recovery: healing well  6.  Health Maintenance: needs pap in 2 years.   7. GDMa1: 2h today and d/w her will need a1c qyear   8. GHTN: ran out of meds a week ago. norvasc 10mg  po sent in and pt has regular medicaid and needs to call for PCP. I d/w her to call and make one for a month or two  9. Chronic migraines: has them outside of pregnancy. No s/s of severe pre-x. Mg sent in  RTC for BP check and for possible birth control visit  , MD [facility], [facility]

## 2019-07-27 LAB — GLUCOSE TOLERANCE, 2 HOURS
Glucose, 2 hour: 118 mg/dL (ref 65–139)
Glucose, GTT - Fasting: 78 mg/dL (ref 65–99)

## 2019-07-30 ENCOUNTER — Encounter: Payer: Self-pay | Admitting: Obstetrics and Gynecology

## 2019-08-13 ENCOUNTER — Other Ambulatory Visit: Payer: Self-pay

## 2019-08-13 ENCOUNTER — Telehealth: Payer: Self-pay | Admitting: *Deleted

## 2019-08-13 ENCOUNTER — Other Ambulatory Visit: Payer: Self-pay | Admitting: Family Medicine

## 2019-08-13 ENCOUNTER — Ambulatory Visit (INDEPENDENT_AMBULATORY_CARE_PROVIDER_SITE_OTHER): Payer: Medicaid Other | Admitting: *Deleted

## 2019-08-13 VITALS — BP 150/90

## 2019-08-13 DIAGNOSIS — O1410 Severe pre-eclampsia, unspecified trimester: Secondary | ICD-10-CM

## 2019-08-13 DIAGNOSIS — Z8759 Personal history of other complications of pregnancy, childbirth and the puerperium: Secondary | ICD-10-CM

## 2019-08-13 DIAGNOSIS — Z0130 Encounter for examination of blood pressure without abnormal findings: Secondary | ICD-10-CM

## 2019-08-13 MED ORDER — LISINOPRIL 10 MG PO TABS: 10.0000 mg | ORAL_TABLET | Freq: Every day | ORAL | 1 refills | Status: DC

## 2019-08-13 NOTE — Telephone Encounter (Signed)
-----   Message from Reva Bores, MD sent at 08/13/2019  1:18 PM EDT -----   ----- Message ----- From: Clovis Riley, RN Sent: 08/13/2019  10:34 AM EDT To: Reva Bores, MD

## 2019-08-13 NOTE — Progress Notes (Signed)
Subjective:  Olivia Park is a 34 y.o. female here for BP check.   Hypertension ROS: taking medications as instructed, no medication side effects noted, no TIA's, no chest pain on exertion, no dyspnea on exertion and no swelling of ankles.    Objective:  BP (!) 150/90   Appearance alert, well appearing, and in no distress. General exam BP noted to be well controlled today in office.    Assessment:   Blood Pressure needs improvement.   Plan:  will let provider know and will call pt with recommendations. Marland Kitchen

## 2019-08-13 NOTE — Progress Notes (Signed)
Patient seen and assessed by nursing staff. Added lisinopril for improved BP control.  Agree with documentation and plan. Return for repeat BP in 7-10 days.

## 2019-08-13 NOTE — Telephone Encounter (Signed)
Called pt to inform her that Dr Shawnie Pons added a medication for her BP and to come back in in 7-10 days to recheck BP. Pt verbalizes and understands.

## 2019-08-20 ENCOUNTER — Ambulatory Visit (INDEPENDENT_AMBULATORY_CARE_PROVIDER_SITE_OTHER): Payer: Medicaid Other | Admitting: *Deleted

## 2019-08-20 ENCOUNTER — Other Ambulatory Visit: Payer: Self-pay

## 2019-08-20 ENCOUNTER — Telehealth: Payer: Self-pay | Admitting: *Deleted

## 2019-08-20 ENCOUNTER — Other Ambulatory Visit: Payer: Self-pay | Admitting: Family Medicine

## 2019-08-20 VITALS — BP 157/112 | HR 81

## 2019-08-20 DIAGNOSIS — O1410 Severe pre-eclampsia, unspecified trimester: Secondary | ICD-10-CM

## 2019-08-20 DIAGNOSIS — Z8759 Personal history of other complications of pregnancy, childbirth and the puerperium: Secondary | ICD-10-CM

## 2019-08-20 MED ORDER — LISINOPRIL 20 MG PO TABS: 20.0000 mg | ORAL_TABLET | Freq: Every day | ORAL | 2 refills | Status: DC

## 2019-08-20 NOTE — Telephone Encounter (Signed)
Left message for pt call back:  

## 2019-08-20 NOTE — Progress Notes (Signed)
Patient seen and assessed by nursing staff.  Agree with documentation and plan.  

## 2019-08-20 NOTE — Progress Notes (Signed)
Subjective:  Olivia Park is a 34 y.o. female here for BP check.   Hypertension ROS: taking medications as instructed, no medication side effects noted, no TIA's, no chest pain on exertion, no dyspnea on exertion and no swelling of ankles.    Objective:  BP (!) 157/112   Pulse 81   Appearance alert, well appearing, and in no distress. General exam BP noted to be well controlled today in office.    Assessment:   Blood Pressure poorly controlled.   Plan:  Will inform provider and will call patient back with recommendations. Pt also requesting to start on Depo for birth control as well.

## 2019-08-21 ENCOUNTER — Telehealth: Payer: Self-pay | Admitting: *Deleted

## 2019-08-21 NOTE — Telephone Encounter (Signed)
Called to inform her that Dr Shawnie Pons increased her BP meds and that we can start her depo per protocol. Will need BP check in about 4 days. Pt will come in Friday for BP check/UPT and start depo.

## 2019-08-24 ENCOUNTER — Other Ambulatory Visit: Payer: Self-pay

## 2019-08-24 ENCOUNTER — Ambulatory Visit (INDEPENDENT_AMBULATORY_CARE_PROVIDER_SITE_OTHER): Payer: Medicaid Other | Admitting: *Deleted

## 2019-08-24 VITALS — BP 141/86

## 2019-08-24 DIAGNOSIS — Z0130 Encounter for examination of blood pressure without abnormal findings: Secondary | ICD-10-CM

## 2019-08-24 DIAGNOSIS — Z8759 Personal history of other complications of pregnancy, childbirth and the puerperium: Secondary | ICD-10-CM

## 2019-08-24 DIAGNOSIS — Z3042 Encounter for surveillance of injectable contraceptive: Secondary | ICD-10-CM

## 2019-08-24 LAB — POCT URINE PREGNANCY: Preg Test, Ur: NEGATIVE

## 2019-08-24 MED ORDER — MEDROXYPROGESTERONE ACETATE 150 MG/ML IM SUSP: 150.0000 mg | INTRAMUSCULAR | 2 refills | Status: DC

## 2019-08-24 MED ORDER — MEDROXYPROGESTERONE ACETATE 150 MG/ML IM SUSP
150.0000 mg | Freq: Once | INTRAMUSCULAR | Status: AC
  Administered 2019-08-24: 150 mg via INTRAMUSCULAR

## 2019-08-24 NOTE — Progress Notes (Signed)
Subjective:  Olivia Park is a 34 y.o. female here for BP check.   Hypertension ROS: taking medications as instructed, no medication side effects noted, no TIA's, no chest pain on exertion, no dyspnea on exertion and no swelling of ankles.    Objective:  BP (!) 141/86   Appearance alert, well appearing, and in no distress. General exam BP noted to be well controlled today in office.    Assessment:   Blood Pressure improved.   Plan:  Current treatment plan is effective, no change in therapy.   Pt also here to start on depo. Last unprotected sex was prior to having her baby.   UPT in office was negative.   Depo given. Next depo due 8/13-8/27.  Pt advised to use back up method for 2 next weeks. Pt verbalizes and understands.

## 2019-08-24 NOTE — Progress Notes (Signed)
Patient seen and assessed by nursing staff.  Agree with documentation and plan.  

## 2019-11-09 ENCOUNTER — Ambulatory Visit: Payer: Medicaid Other

## 2019-11-12 ENCOUNTER — Ambulatory Visit: Payer: Medicaid Other

## 2019-11-15 ENCOUNTER — Ambulatory Visit (INDEPENDENT_AMBULATORY_CARE_PROVIDER_SITE_OTHER): Payer: Medicaid Other | Admitting: *Deleted

## 2019-11-15 ENCOUNTER — Other Ambulatory Visit: Payer: Self-pay

## 2019-11-15 VITALS — BP 145/89 | HR 73

## 2019-11-15 DIAGNOSIS — Z3042 Encounter for surveillance of injectable contraceptive: Secondary | ICD-10-CM | POA: Diagnosis not present

## 2019-11-15 MED ORDER — MEDROXYPROGESTERONE ACETATE 150 MG/ML IM SUSP
150.0000 mg | Freq: Once | INTRAMUSCULAR | Status: AC
  Administered 2019-11-15: 150 mg via INTRAMUSCULAR

## 2019-11-15 NOTE — Progress Notes (Signed)
Date last pap: 11/2017 Last Depo-Provera: 08/24/2019. Side Effects if any: None. Serum HCG indicated? NA. Depo-Provera 150 mg IM given by: Scheryl Marten, RN. Next appointment due Nov 4th-18th.   Also discussed with pt to make an appt with PCP regarding her BP. Pt states she will.

## 2019-11-21 NOTE — Progress Notes (Signed)
Patient was assessed and managed by nursing staff during this encounter. I have reviewed the chart and agree with the documentation and plan. I have also made any necessary editorial changes.  Monetta Lick, MD 11/21/2019 6:34 PM   

## 2020-01-31 ENCOUNTER — Other Ambulatory Visit: Payer: Self-pay

## 2020-01-31 ENCOUNTER — Ambulatory Visit (INDEPENDENT_AMBULATORY_CARE_PROVIDER_SITE_OTHER): Payer: Medicaid Other | Admitting: *Deleted

## 2020-01-31 VITALS — BP 166/107 | HR 77

## 2020-01-31 DIAGNOSIS — Z8759 Personal history of other complications of pregnancy, childbirth and the puerperium: Secondary | ICD-10-CM

## 2020-01-31 DIAGNOSIS — Z3042 Encounter for surveillance of injectable contraceptive: Secondary | ICD-10-CM | POA: Diagnosis not present

## 2020-01-31 MED ORDER — MEDROXYPROGESTERONE ACETATE 150 MG/ML IM SUSP
150.0000 mg | Freq: Once | INTRAMUSCULAR | Status: AC
  Administered 2020-01-31: 150 mg via INTRAMUSCULAR

## 2020-01-31 MED ORDER — MEDROXYPROGESTERONE ACETATE 150 MG/ML IM SUSP: 150.0000 mg | INTRAMUSCULAR | 2 refills | Status: DC

## 2020-01-31 MED ORDER — AMLODIPINE BESYLATE 5 MG PO TABS: 5.0000 mg | ORAL_TABLET | Freq: Every day | ORAL | 2 refills | Status: DC

## 2020-01-31 NOTE — Progress Notes (Signed)
Date last pap: 12/13/2017 Last Depo-Provera: 11/15/2019. Side Effects if any: none. Serum HCG indicated? NA. Depo-Provera 150 mg IM given by: Scheryl Marten, RN. Next appointment due Jan 20-Feb 3rd.  Pt BP was elevated today in office. She has appt with PCP on Nov 18th to discuss her BP. Dr Shawnie Pons made aware of BP and we will restart her on some medication until she she can see PCP. Pt to recheck BP in the next day or two and send it via Mychart.

## 2020-02-01 NOTE — Progress Notes (Signed)
Patient seen and assessed by nursing staff.  Agree with documentation and plan.  

## 2020-04-17 ENCOUNTER — Ambulatory Visit: Payer: Medicaid Other

## 2020-04-21 ENCOUNTER — Ambulatory Visit: Payer: Medicaid Other

## 2020-04-29 ENCOUNTER — Other Ambulatory Visit: Payer: Self-pay

## 2020-04-29 ENCOUNTER — Ambulatory Visit (INDEPENDENT_AMBULATORY_CARE_PROVIDER_SITE_OTHER): Payer: Medicaid Other

## 2020-04-29 VITALS — BP 149/103

## 2020-04-29 DIAGNOSIS — Z3042 Encounter for surveillance of injectable contraceptive: Secondary | ICD-10-CM

## 2020-04-29 MED ORDER — MEDROXYPROGESTERONE ACETATE 150 MG/ML IM SUSP
150.0000 mg | Freq: Once | INTRAMUSCULAR | Status: AC
  Administered 2020-04-29: 150 mg via INTRAMUSCULAR

## 2020-04-29 NOTE — Progress Notes (Signed)
Patient was assessed and managed by nursing staff during this encounter. I have reviewed the chart and agree with the documentation and plan. I have also made any necessary editorial changes.  Jaynie Collins, MD 04/29/2020 4:48 PM

## 2020-04-29 NOTE — Progress Notes (Signed)
Date last pap: 12/13/2017 Last Depo-Provera: 01/31/2020 Side Effects if any: None Serum HCG indicated? NA Depo-Provera 150 mg IM given by: Philemon Kingdom, CMA Next appointment due: April 19th - May 3rd, 2022   ** Pt has been advised to contact PCP for elevated blood pressures. Pt also informed to schedule annual exam. Pt verbalizes understanding.

## 2020-07-15 ENCOUNTER — Ambulatory Visit: Payer: Medicaid Other

## 2020-07-15 ENCOUNTER — Ambulatory Visit: Payer: Medicaid Other | Admitting: Obstetrics & Gynecology

## 2020-07-31 IMAGING — US US MFM OB LIMITED
1 series · 13 of 13 positions shown · non-contrast
Comparison: none

[Series 1: us mfm ob limited · 13 acquisitions, 13 frames shown]
[im 1/13]
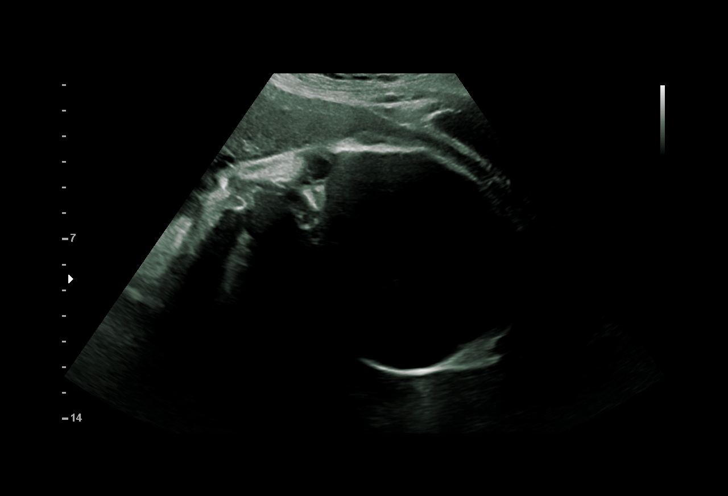
[im 2/13]
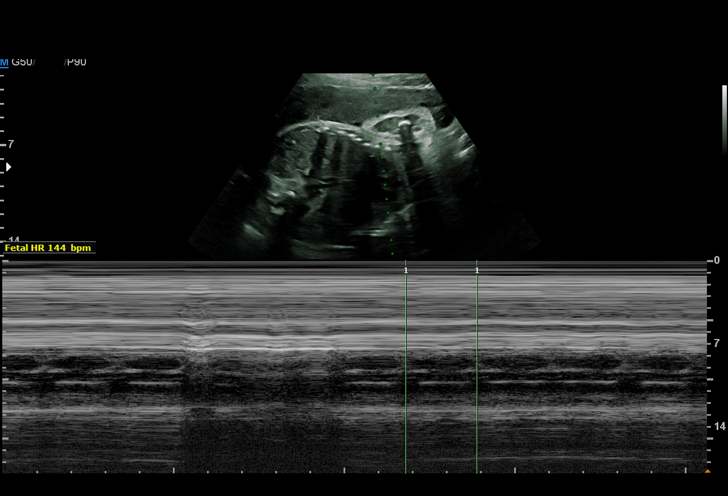
[im 3/13]
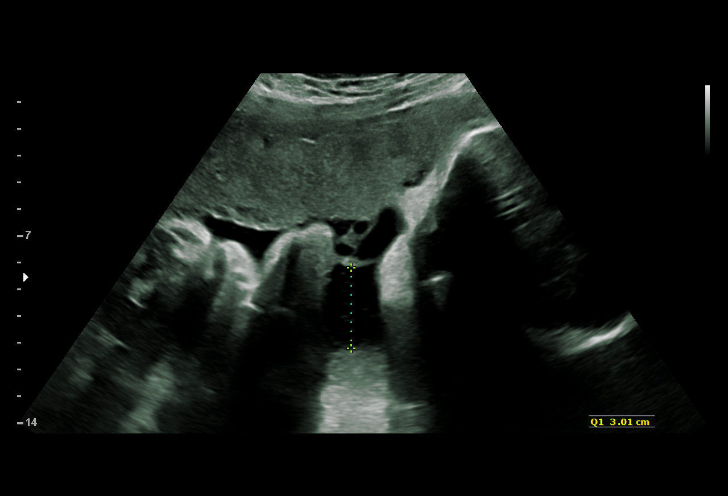
[im 4/13]
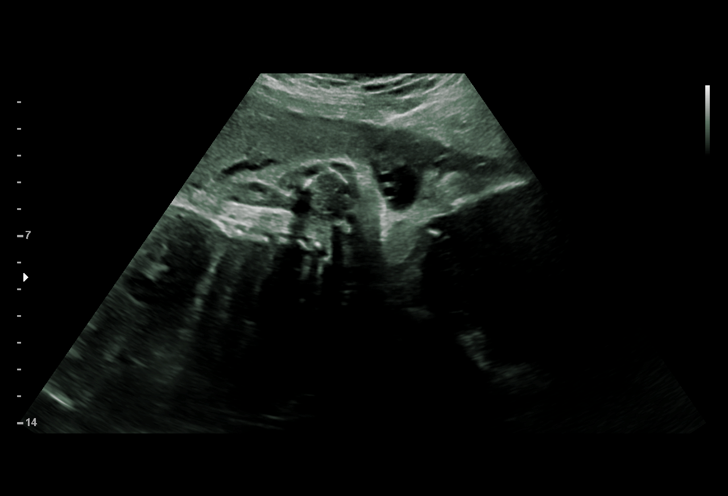
[im 5/13]
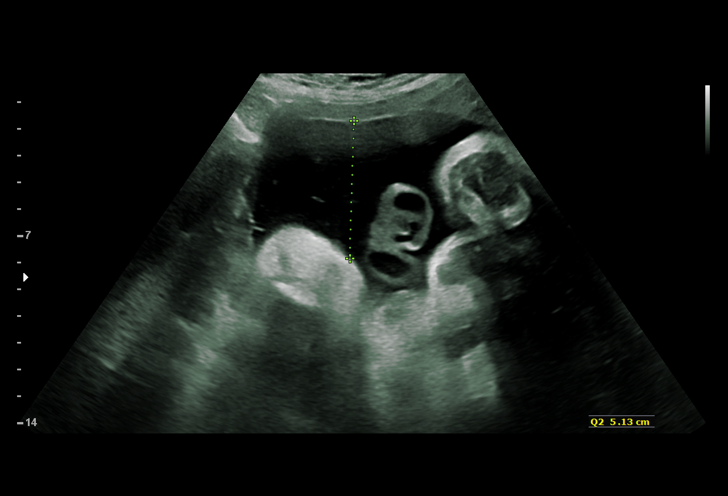
[im 6/13]
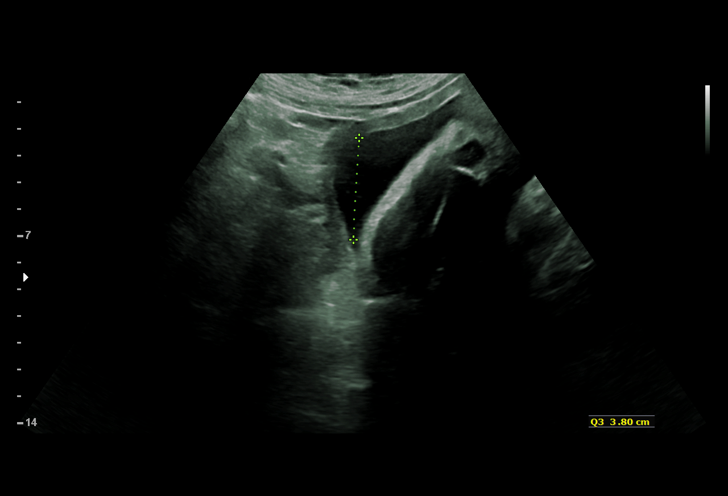
[im 7/13]
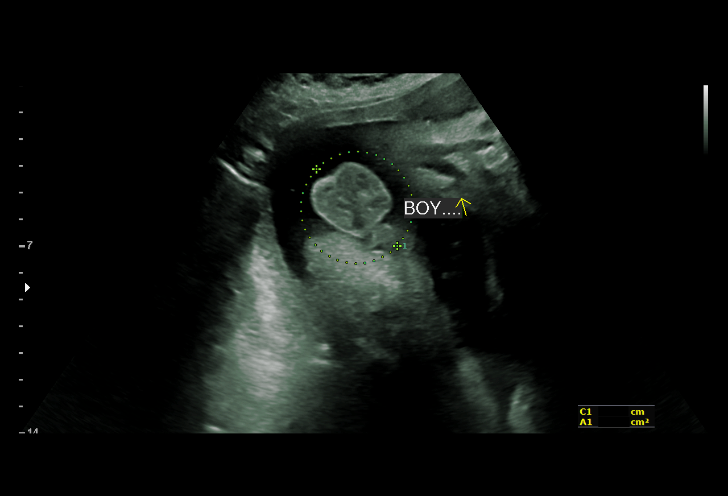
[im 8/13]
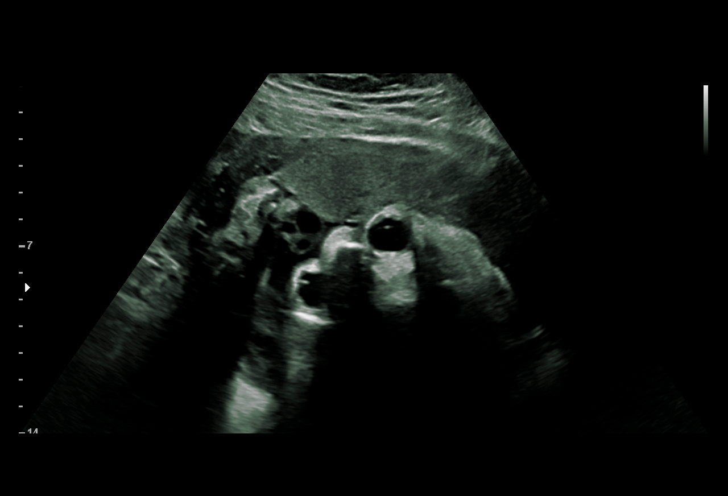
[im 9/13]
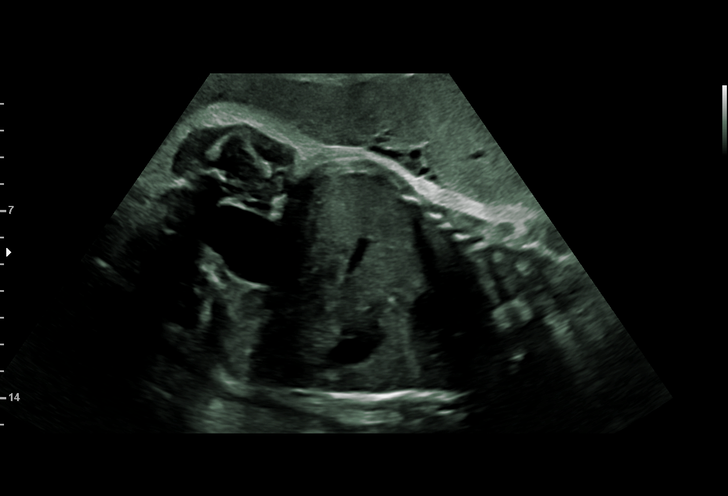
[im 10/13]
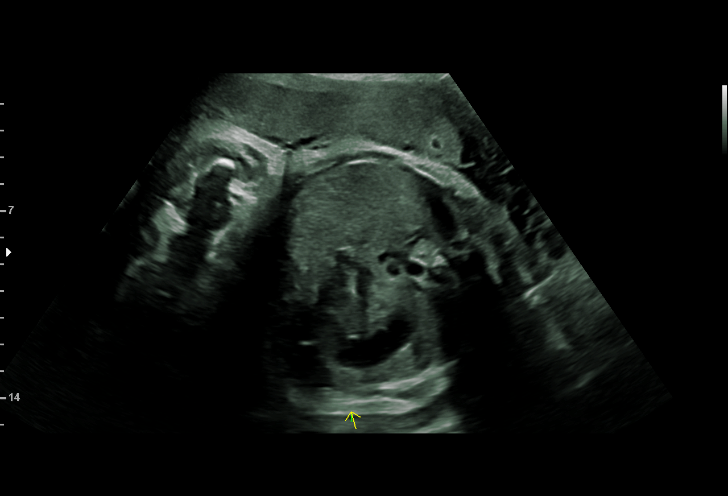
[im 11/13]
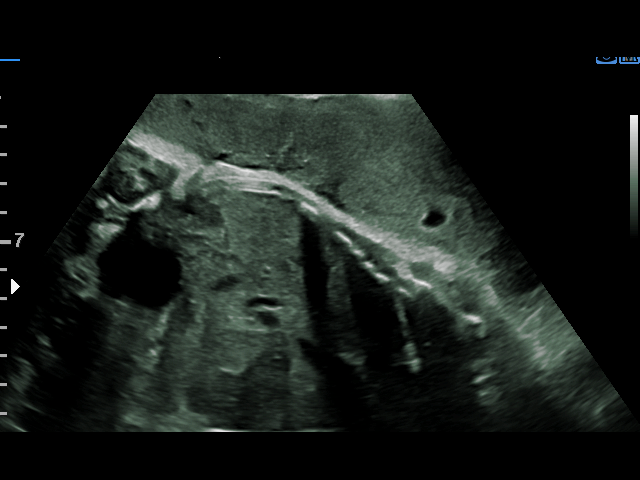
[im 12/13]
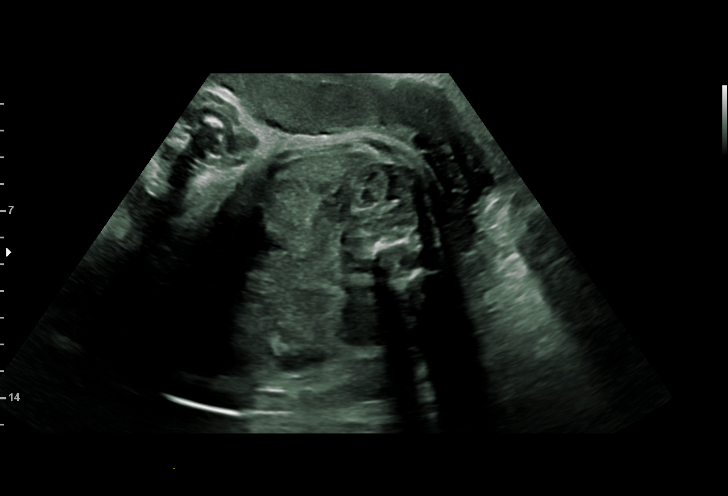
[im 13/13]
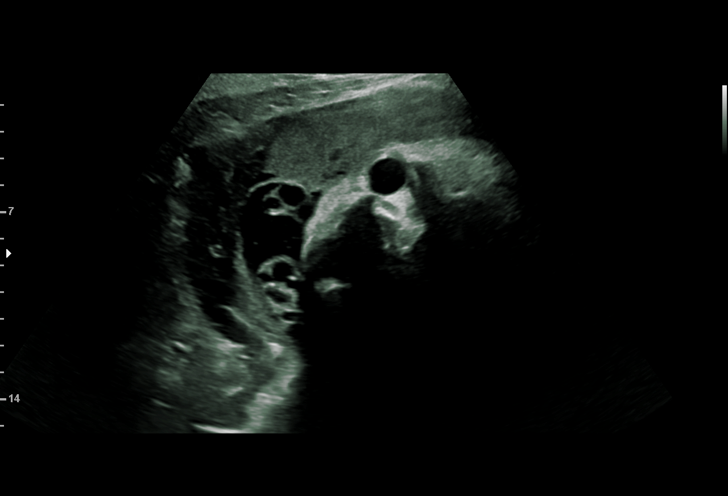

[13 of 13 positions shown; findings below may reference images not displayed]

1  US MFM OB LIMITED                    76815.01     OLAVE
                                                       INGA CASTILLO
 ----------------------------------------------------------------------

 ----------------------------------------------------------------------
Indications

  Gestational diabetes in pregnancy, diet
  controlled
  32 weeks gestation of pregnancy
  Encounter for other antenatal screening
  follow-up (low risk NIPS, V.U22)
  Obesity complicating pregnancy, second
  trimester
  Poor obstetric history: Previous gestational
  diabetes
  Poor obstetric history: Previous
  preeclampsia / eclampsia/gestational HTN
  Poor obstetric history: Previous preterm
  delivery, antepartum (57w5d)
  Oligohydramnios / Decreased amniotic fluid
  volume
 ----------------------------------------------------------------------
Vital Signs

                                                Height:        5'5"
Fetal Evaluation

 Num Of Fetuses:          1
 Fetal Heart Rate(bpm):   144
 Cardiac Activity:        Observed
 Presentation:            Cephalic

 Amniotic Fluid
 AFI FV:      Within normal limits

 AFI Sum(cm)     %Tile       Largest Pocket(cm)
 11.94           31

 RUQ(cm)                     LUQ(cm)        LLQ(cm)

OB History

 Gravidity:    3         Term:   2        Prem:   0        SAB:   0
 TOP:          0       Ectopic:  0        Living: 2
Gestational Age

 LMP:           32w 2d        Date:  09/18/18                 EDD:   06/25/19
 Best:          32w 2d     Det. By:  LMP  (09/18/18)          EDD:   06/25/19
Anatomy

 Stomach:               Appears normal, left   Kidneys:                Appear normal
                        sided
 Cord Vessels:          Appears normal (3      Bladder:                Appears normal
                        vessel cord)
Cervix Uterus Adnexa

 Cervix
 Not visualized (advanced GA >57wks)
Impression

 Limited exam A1 GDM
 Follow up to assess amniotic fluid given prior low amniotic
 fluid index.
 Today the amnioti fluid is improved.
Recommendations

 Ms. Muff was previously scheduled for growth on [DATE].

## 2020-09-03 IMAGING — US US MFM OB LIMITED
1 series · 14 of 26 positions shown · non-contrast
Comparison: none

[Series 1: us mfm ob limited · 26 acquisitions, 14 frames shown]
[im 1/26]
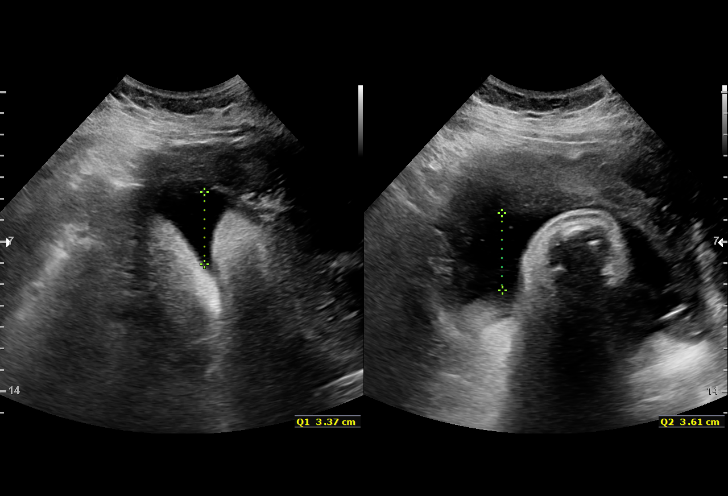
[im 3/26]
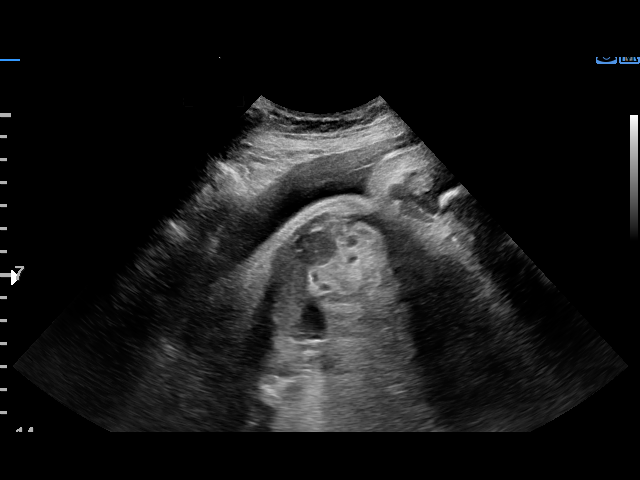
[im 5/26]
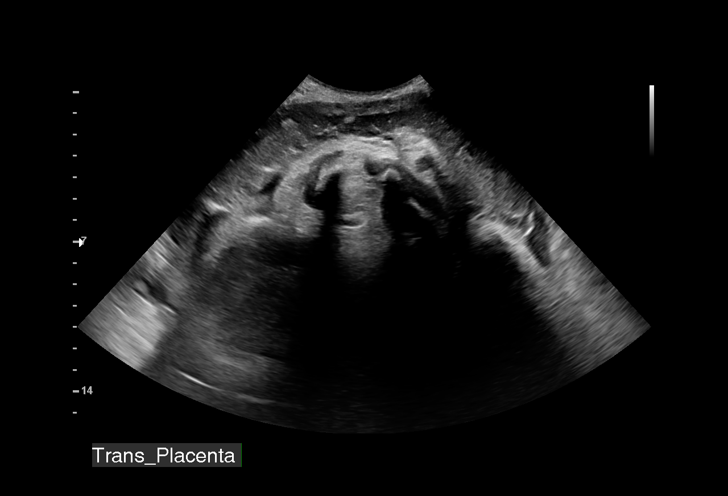
[im 7/26]
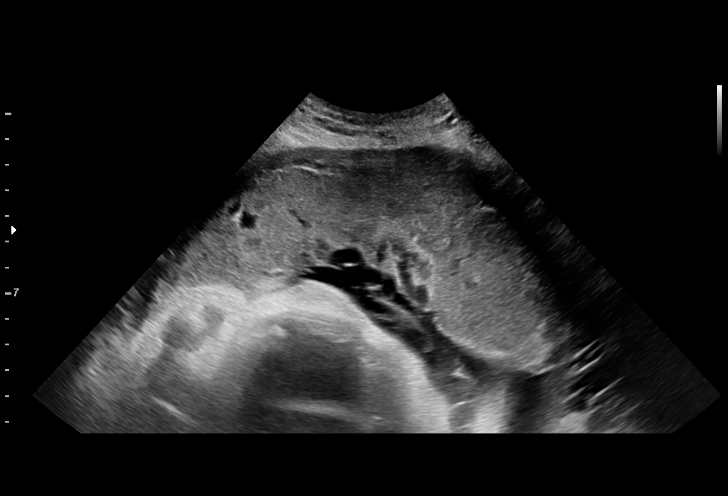
[im 9/26]
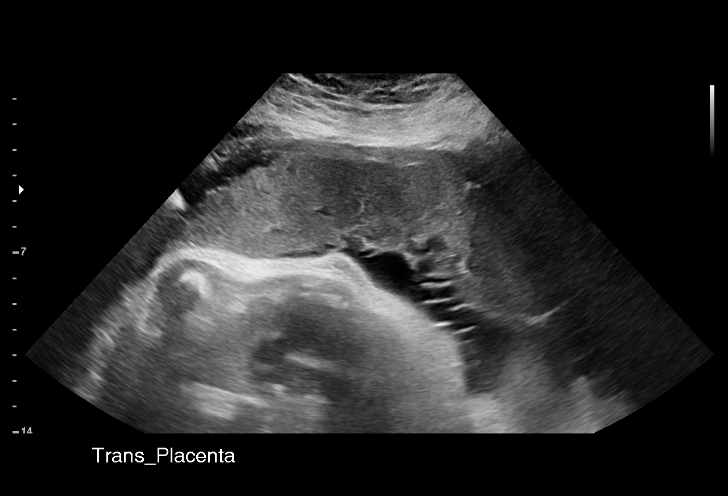
[im 11/26]
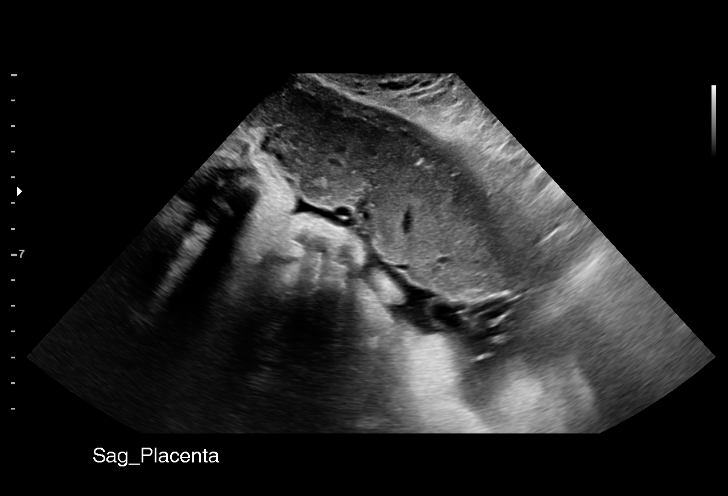
[im 13/26]
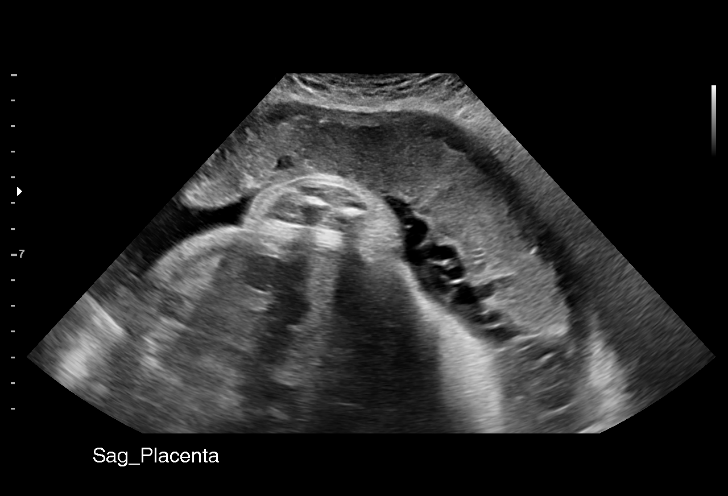
[im 14/26]
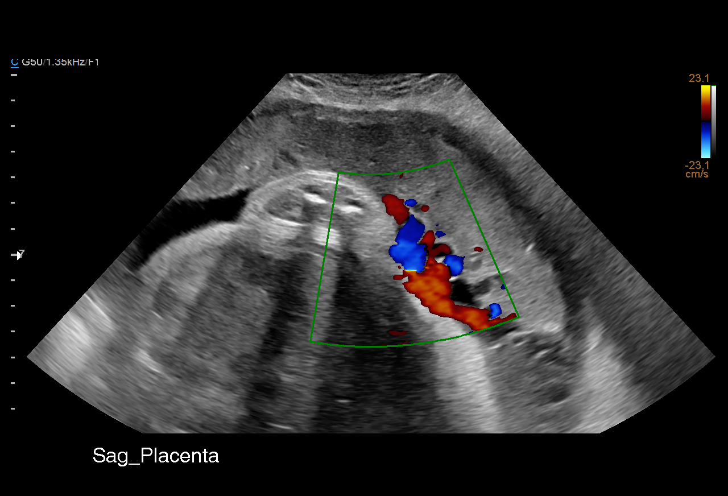
[im 16/26]
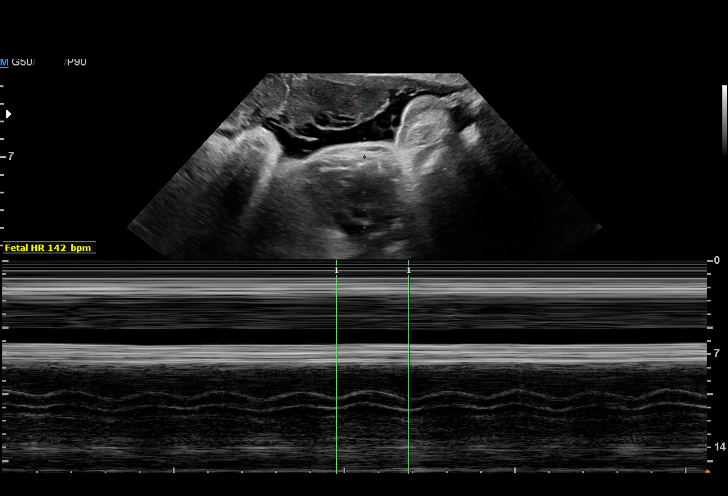
[im 18/26]
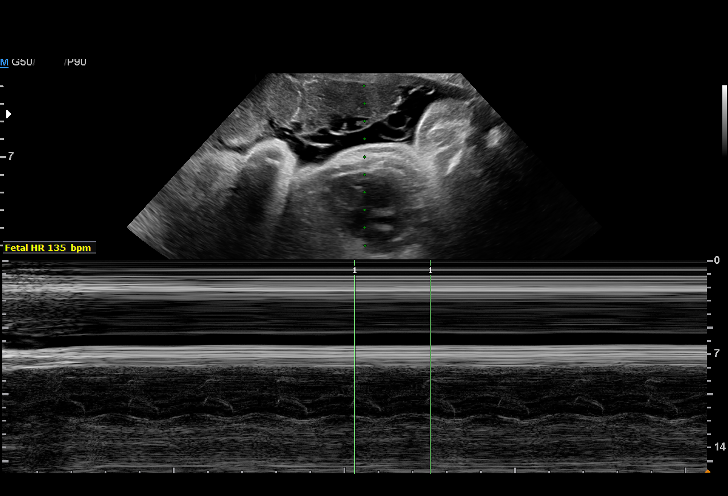
[im 20/26]
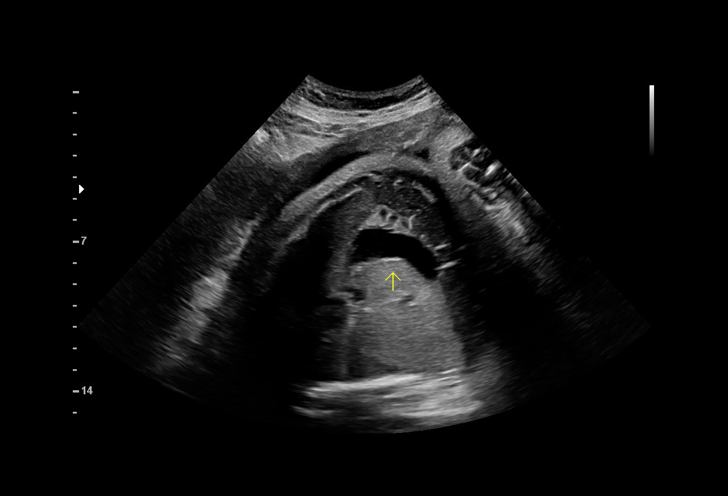
[im 22/26]
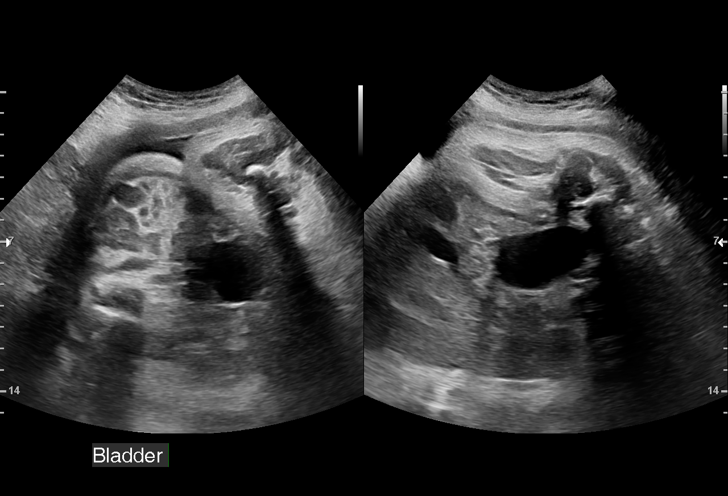
[im 24/26]
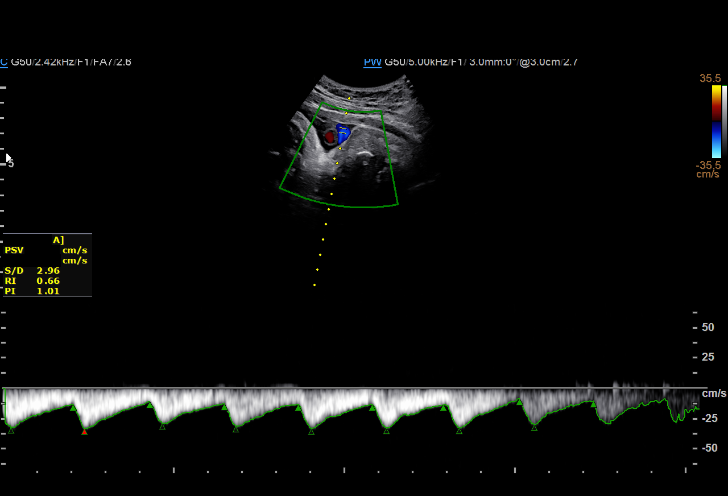
[im 26/26]
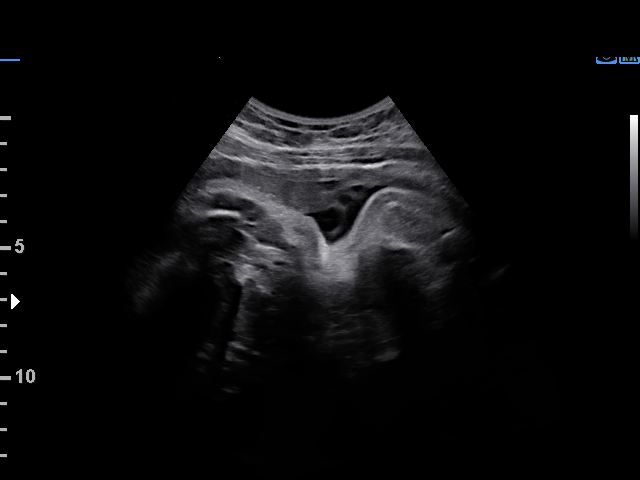

[14 of 26 positions shown; findings below may reference images not displayed]

----------------------------------------------------------------------

 ----------------------------------------------------------------------
Indications

  Oligohydramnios / Decreased amniotic fluid
  volume
  Gestational diabetes in pregnancy, diet
  controlled
  Obesity complicating pregnancy, third
  trimester (BMI:39)
  Encounter for other antenatal screening
  follow-up (low risk NIPS, D.9JJ)
  Poor obstetric history: Previous gestational
  diabetes
  Poor obstetric history: Previous
  preeclampsia / eclampsia/gestational HTN
  Poor obstetric history: Previous preterm
  delivery, antepartum (55w7d)
  37 weeks gestation of pregnancy
 ----------------------------------------------------------------------
Vital Signs

                                                Height:        5'5"
Fetal Evaluation

 Num Of Fetuses:         1
 Fetal Heart Rate(bpm):  142
 Cardiac Activity:       Observed
 Presentation:           Cephalic
 Placenta:               Anterior
 P. Cord Insertion:      Visualized, central

 Amniotic Fluid
 AFI FV:      Within normal limits

 AFI Sum(cm)     %Tile       Largest Pocket(cm)
 12.44           42
 RUQ(cm)       RLQ(cm)       LUQ(cm)        LLQ(cm)

OB History

 Gravidity:    3         Term:   2        Prem:   0        SAB:   0
 TOP:          0       Ectopic:  0        Living: 2
Gestational Age

 LMP:           37w 1d        Date:  09/18/18                 EDD:   06/25/19
 Best:          37w 1d     Det. By:  LMP  (09/18/18)          EDD:   06/25/19
Anatomy

 Stomach:               Appears normal, left   Bladder:                Appears normal
                        sided
 Kidneys:               Appear normal
Comments

 This patient was seen for a limited ultrasound to assess the
 amniotic fluid levels which were noted to be in the lower
 normal range during her last ultrasound exam 1 week ago.
 The patient reports that she has had an unresolving
 headache for the past day or so.  The headache has not
 been relieved even after taking Tylenol.  Her blood pressures
 in our office today were 142/97 and 156/95.

 On today's ultrasound exam, the total AFI measures
 centimeters.  Fetal movements and fetal breathing
 movements were noted throughout today's ultrasound exam.

 Due to my concerns regarding preeclampsia due to her
 headache and elevated blood pressures, the patient was sent
 to the [facility] for evaluation immediately following today's
 ultrasound exam.

 Delivery is recommended at her current gestational age
 should she be diagnosed with preeclampsia or if her blood
 pressures are persistently elevated.

## 2020-11-03 ENCOUNTER — Other Ambulatory Visit: Payer: Self-pay

## 2020-11-03 ENCOUNTER — Other Ambulatory Visit (HOSPITAL_COMMUNITY)
Admission: RE | Admit: 2020-11-03 | Discharge: 2020-11-03 | Disposition: A | Discharge status: Home / Self Care | Payer: Medicaid Other | Source: Ambulatory Visit | Attending: Family Medicine | Admitting: Family Medicine

## 2020-11-03 ENCOUNTER — Encounter: Payer: Self-pay | Admitting: Family Medicine

## 2020-11-03 ENCOUNTER — Ambulatory Visit (INDEPENDENT_AMBULATORY_CARE_PROVIDER_SITE_OTHER): Payer: Medicaid Other | Admitting: Family Medicine

## 2020-11-03 VITALS — BP 155/104 | HR 69 | Wt 220.0 lb

## 2020-11-03 DIAGNOSIS — Z124 Encounter for screening for malignant neoplasm of cervix: Secondary | ICD-10-CM

## 2020-11-03 DIAGNOSIS — Z01419 Encounter for gynecological examination (general) (routine) without abnormal findings: Secondary | ICD-10-CM

## 2020-11-03 DIAGNOSIS — Z113 Encounter for screening for infections with a predominantly sexual mode of transmission: Secondary | ICD-10-CM

## 2020-11-03 DIAGNOSIS — Z30013 Encounter for initial prescription of injectable contraceptive: Secondary | ICD-10-CM

## 2020-11-03 LAB — POCT URINE PREGNANCY: Preg Test, Ur: NEGATIVE

## 2020-11-03 MED ORDER — MEDROXYPROGESTERONE ACETATE 150 MG/ML IM SUSP: 150.0000 mg | INTRAMUSCULAR | 3 refills | Status: AC

## 2020-11-03 MED ORDER — MEDROXYPROGESTERONE ACETATE 150 MG/ML IM SUSP
150.0000 mg | Freq: Once | INTRAMUSCULAR | Status: AC
  Administered 2020-11-03: 150 mg via INTRAMUSCULAR

## 2020-11-03 NOTE — Patient Instructions (Signed)
Preventive Care 21-34 Years Old, Female Preventive care refers to lifestyle choices and visits with your health care provider that can promote health and wellness. This includes: A yearly physical exam. This is also called an annual wellness visit. Regular dental and eye exams. Immunizations. Screening for certain conditions. Healthy lifestyle choices, such as: Eating a healthy diet. Getting regular exercise. Not using drugs or products that contain nicotine and tobacco. Limiting alcohol use. What can I expect for my preventive care visit? Physical exam Your health care provider may check your: Height and weight. These may be used to calculate your BMI (body mass index). BMI is a measurement that tells if you are at a healthy weight. Heart rate and blood pressure. Body temperature. Skin for abnormal spots. Counseling Your health care provider may ask you questions about your: Past medical problems. Family's medical history. Alcohol, tobacco, and drug use. Emotional well-being. Home life and relationship well-being. Sexual activity. Diet, exercise, and sleep habits. Work and work environment. Access to firearms. Method of birth control. Menstrual cycle. Pregnancy history. What immunizations do I need?  Vaccines are usually given at various ages, according to a schedule. Your health care provider will recommend vaccines for you based on your age, medicalhistory, and lifestyle or other factors, such as travel or where you work. What tests do I need?  Blood tests Lipid and cholesterol levels. These may be checked every 5 years starting at age 20. Hepatitis C test. Hepatitis B test. Screening Diabetes screening. This is done by checking your blood sugar (glucose) after you have not eaten for a while (fasting). STD (sexually transmitted disease) testing, if you are at risk. BRCA-related cancer screening. This may be done if you have a family history of breast, ovarian, tubal, or  peritoneal cancers. Pelvic exam and Pap test. This may be done every 3 years starting at age 21. Starting at age 30, this may be done every 5 years if you have a Pap test in combination with an HPV test. Talk with your health care provider about your test results, treatment options,and if necessary, the need for more tests. Follow these instructions at home: Eating and drinking  Eat a healthy diet that includes fresh fruits and vegetables, whole grains, lean protein, and low-fat dairy products. Take vitamin and mineral supplements as recommended by your health care provider. Do not drink alcohol if: Your health care provider tells you not to drink. You are pregnant, may be pregnant, or are planning to become pregnant. If you drink alcohol: Limit how much you have to 0-1 drink a day. Be aware of how much alcohol is in your drink. In the [place] one drink equals one 12 oz bottle of beer (355 mL), one 5 oz glass of wine (148 mL), or one 1 oz glass of hard liquor (44 mL).  Lifestyle Take daily care of your teeth and gums. Brush your teeth every morning and night with fluoride toothpaste. Floss one time each day. Stay active. Exercise for at least 30 minutes 5 or more days each week. Do not use any products that contain nicotine or tobacco, such as cigarettes, e-cigarettes, and chewing tobacco. If you need help quitting, ask your health care provider. Do not use drugs. If you are sexually active, practice safe sex. Use a condom or other form of protection to prevent STIs (sexually transmitted infections). If you do not wish to become pregnant, use a form of birth control. If you plan to become pregnant, see your health care   provider for a prepregnancy visit. Find healthy ways to cope with stress, such as: Meditation, yoga, or listening to music. Journaling. Talking to a trusted person. Spending time with friends and family. Safety Always wear your seat belt while driving or riding in a  vehicle. Do not drive: If you have been drinking alcohol. Do not ride with someone who has been drinking. When you are tired or distracted. While texting. Wear a helmet and other protective equipment during sports activities. If you have firearms in your house, make sure you follow all gun safety procedures. Seek help if you have been physically or sexually abused. What's next? Go to your health care provider once a year for an annual wellness visit. Ask your health care provider how often you should have your eyes and teeth checked. Stay up to date on all vaccines. This information is not intended to replace advice given to you by your health care provider. Make sure you discuss any questions you have with your healthcare provider. Document Revised: 11/11/2019 Document Reviewed: 11/24/2017 Elsevier Patient Education  2022 [employer].

## 2020-11-03 NOTE — Progress Notes (Signed)
Subjective:     Olivia Park is a 35 y.o. female and is here for a comprehensive physical exam. The patient reports no problems. Desires to resume Depo. LMP end of July. Last sexual encounter in June.   The following portions of the patient's history were reviewed and updated as appropriate: allergies, current medications, past family history, past medical history, past social history, past surgical history, and problem list.  Review of Systems Pertinent items noted in HPI and remainder of comprehensive ROS otherwise negative.   Objective:    BP (!) 155/104   Pulse 69   Wt 220 lb (99.8 kg)   LMP 10/25/2020 (Exact Date)   BMI 36.61 kg/m  General appearance: alert, cooperative, appears stated age, and mildly obese Head: Normocephalic, without obvious abnormality, atraumatic Neck: no adenopathy, supple, symmetrical, trachea midline, and thyroid not enlarged, symmetric, no tenderness/mass/nodules Lungs: clear to auscultation bilaterally Breasts: normal appearance, no masses or tenderness Heart: regular rate and rhythm, S1, S2 normal, no murmur, click, rub or gallop Abdomen: soft, non-tender; bowel sounds normal; no masses,  no organomegaly Pelvic: cervix normal in appearance, external genitalia normal, no adnexal masses or tenderness, no cervical motion tenderness, uterus normal size, shape, and consistency, and vagina normal without discharge Extremities: extremities normal, atraumatic, no cyanosis or edema Pulses: 2+ and symmetric Skin: Skin color, texture, turgor normal. No rashes or lesions    Assessment:    Healthy female exam.      Plan:  Encounter for gynecological examination without abnormal finding - Plan: POCT urine pregnancy  Screening for malignant neoplasm of cervix - Plan: Cytology - PAP  Screen for STD (sexually transmitted disease) - Plan: Cervicovaginal ancillary only( [place])  Encounter for initial prescription of injectable contraceptive - Plan:  medroxyPROGESTERone (DEPO-PROVERA) injection 150 mg, medroxyPROGESTERone (DEPO-PROVERA) 150 MG/ML injection Return in 1 year (on 11/03/2021).    See After Visit Summary for Counseling Recommendations

## 2020-11-03 NOTE — Progress Notes (Signed)
Last depo 04/29/20, would like to restart. Last unprotected sex was in June.

## 2020-11-04 ENCOUNTER — Encounter: Payer: Self-pay | Admitting: Family Medicine

## 2020-11-05 LAB — CERVICOVAGINAL ANCILLARY ONLY
Bacterial Vaginitis (gardnerella): POSITIVE — AB
Candida Glabrata: NEGATIVE
Candida Vaginitis: NEGATIVE
Comment: NEGATIVE
Comment: NEGATIVE
Comment: NEGATIVE

## 2020-11-05 MED ORDER — METRONIDAZOLE 500 MG PO TABS: 500.0000 mg | ORAL_TABLET | Freq: Two times a day (BID) | ORAL | 0 refills | Status: AC

## 2020-11-05 NOTE — Addendum Note (Signed)
Addended by: Reva Bores on: 11/05/2020 12:50 PM   Modules accepted: Orders

## 2020-11-07 LAB — CYTOLOGY - PAP
Adequacy: ABSENT
Chlamydia: NEGATIVE
Comment: NEGATIVE
Comment: NEGATIVE
Comment: NEGATIVE
Comment: NORMAL
Diagnosis: NEGATIVE
High risk HPV: NEGATIVE
Neisseria Gonorrhea: NEGATIVE
Trichomonas: NEGATIVE

## 2021-01-06 ENCOUNTER — Telehealth: Payer: Self-pay | Admitting: Radiology

## 2021-01-06 NOTE — Telephone Encounter (Signed)
Left message returning patient's call to scheduled depo injection. Last was 11/03/20- due 01/19/21-01/30/21. Requested that patient call office to schedule appointment.

## 2021-01-21 ENCOUNTER — Ambulatory Visit (INDEPENDENT_AMBULATORY_CARE_PROVIDER_SITE_OTHER): Payer: Medicaid Other

## 2021-01-21 ENCOUNTER — Other Ambulatory Visit: Payer: Self-pay

## 2021-01-21 VITALS — BP 150/97 | HR 72

## 2021-01-21 DIAGNOSIS — Z3042 Encounter for surveillance of injectable contraceptive: Secondary | ICD-10-CM

## 2021-01-21 MED ORDER — MEDROXYPROGESTERONE ACETATE 150 MG/ML IM SUSP
150.0000 mg | INTRAMUSCULAR | Status: AC
  Administered 2021-01-21: 150 mg via INTRAMUSCULAR

## 2021-01-21 NOTE — Progress Notes (Signed)
Date last pap: 11/03/20. Last Depo-Provera: 11/03/20. Side Effects if any: None. Serum HCG indicated? N/A. Depo-Provera 150 mg IM given by: Kennon Portela . Next appointment due 04/08/21-04/22/21.

## 2021-01-21 NOTE — Progress Notes (Signed)
Patient was assessed and managed by nursing staff during this encounter. I have reviewed the chart and agree with the documentation and plan.   Jaynie Collins, MD 01/21/2021 10:17 AM

## 2021-04-14 ENCOUNTER — Ambulatory Visit: Payer: Medicaid Other

## 2021-12-26 ENCOUNTER — Other Ambulatory Visit: Payer: Self-pay

## 2021-12-26 ENCOUNTER — Emergency Department (HOSPITAL_COMMUNITY): Payer: Medicaid Other

## 2021-12-26 ENCOUNTER — Emergency Department (HOSPITAL_COMMUNITY)
Admission: EM | Admit: 2021-12-26 | Discharge: 2021-12-27 | Discharge status: Against Medical Advice | Payer: Medicaid Other | Attending: Emergency Medicine | Admitting: Emergency Medicine

## 2021-12-26 DIAGNOSIS — I10 Essential (primary) hypertension: Secondary | ICD-10-CM | POA: Insufficient documentation

## 2021-12-26 DIAGNOSIS — R519 Headache, unspecified: Secondary | ICD-10-CM | POA: Diagnosis present

## 2021-12-26 DIAGNOSIS — Z5321 Procedure and treatment not carried out due to patient leaving prior to being seen by health care provider: Secondary | ICD-10-CM | POA: Diagnosis not present

## 2021-12-26 DIAGNOSIS — R42 Dizziness and giddiness: Secondary | ICD-10-CM | POA: Insufficient documentation

## 2021-12-26 LAB — CBC WITH DIFFERENTIAL/PLATELET
Abs Immature Granulocytes: 0.02 10*3/uL (ref 0.00–0.07)
Basophils Absolute: 0 10*3/uL (ref 0.0–0.1)
Basophils Relative: 0 %
Eosinophils Absolute: 0.1 10*3/uL (ref 0.0–0.5)
Eosinophils Relative: 1 %
HCT: 37.7 % (ref 36.0–46.0)
Hemoglobin: 12.4 g/dL (ref 12.0–15.0)
Immature Granulocytes: 0 %
Lymphocytes Relative: 33 %
Lymphs Abs: 2.9 10*3/uL (ref 0.7–4.0)
MCH: 27.5 pg (ref 26.0–34.0)
MCHC: 32.9 g/dL (ref 30.0–36.0)
MCV: 83.6 fL (ref 80.0–100.0)
Monocytes Absolute: 0.5 10*3/uL (ref 0.1–1.0)
Monocytes Relative: 5 %
Neutro Abs: 5.4 10*3/uL (ref 1.7–7.7)
Neutrophils Relative %: 61 %
Platelets: 235 10*3/uL (ref 150–400)
RBC: 4.51 MIL/uL (ref 3.87–5.11)
RDW: 12.6 % (ref 11.5–15.5)
WBC: 8.9 10*3/uL (ref 4.0–10.5)
nRBC: 0 % (ref 0.0–0.2)

## 2021-12-26 LAB — I-STAT BETA HCG BLOOD, ED (MC, WL, AP ONLY): I-stat hCG, quantitative: <5 m[IU]/mL (ref <5)

## 2021-12-26 LAB — URINALYSIS, ROUTINE W REFLEX MICROSCOPIC
Bilirubin Urine: NEGATIVE
Glucose, UA: NEGATIVE mg/dL
Ketones, ur: NEGATIVE mg/dL
Nitrite: NEGATIVE
Protein, ur: NEGATIVE mg/dL
RBC / HPF: >50 RBC/hpf — ABNORMAL HIGH (ref 0–5)
Specific Gravity, Urine: 1.015 (ref 1.005–1.030)
pH: 6 (ref 5.0–8.0)

## 2021-12-26 LAB — BASIC METABOLIC PANEL
Anion gap: 10 (ref 5–15)
BUN: 13 mg/dL (ref 6–20)
CO2: 24 mmol/L (ref 22–32)
Calcium: 9.7 mg/dL (ref 8.9–10.3)
Chloride: 107 mmol/L (ref 98–111)
Creatinine, Ser: 0.94 mg/dL (ref 0.44–1.00)
GFR, Estimated: >60 mL/min (ref >60)
Glucose, Bld: 114 mg/dL — ABNORMAL HIGH (ref 70–99)
Potassium: 3.1 mmol/L — ABNORMAL LOW (ref 3.5–5.1)
Sodium: 141 mmol/L (ref 135–145)

## 2021-12-26 NOTE — ED Provider Triage Note (Signed)
Emergency Medicine Provider Triage Evaluation Note  SUMMAR MCGLOTHLIN , a 36 y.o. female  was evaluated in triage.  Pt complains of severe headache and dizziness.  Patient with history of chronic migraines and hypertension.  States that at approximately 3 PM this afternoon she started to feel that the room was spinning around her and had the worst headache she had experience.  She does endorse this being the worst headache of her life.  She denies any trauma prior to onset.  States that she has been compliant with her medications.  Review of Systems  Positive: As above Negative: As above  Physical Exam  BP (!) 171/110 (BP Location: Right Arm)   Pulse (!) 106   Temp 99.1 F (37.3 C) (Oral)   Resp 15   SpO2 100%  Gen:   Awake, no distress   Resp:  Normal effort  MSK:   Moves extremities without difficulty  Other:    Medical Decision Making  Medically screening exam initiated at 8:02 PM.  Appropriate orders placed.  Mora Bellman was informed that the remainder of the evaluation will be completed by another provider, this initial triage assessment does not replace that evaluation, and the importance of remaining in the ED until their evaluation is complete.     Ronny Bacon 12/26/21 2003

## 2021-12-26 NOTE — ED Triage Notes (Signed)
Pt here from work via [employer] for feeling dizzy and having migraine. Pt has hx of HTN and is medication compliant. Cbg 107, 170/100, BP initially was 202/96.

## 2021-12-27 NOTE — ED Notes (Signed)
Pt name called 3x for updated vitals, no response  

## 2022-02-04 ENCOUNTER — Institutional Professional Consult (permissible substitution): Payer: Medicaid Other | Admitting: Adult Health

## 2022-02-12 ENCOUNTER — Institutional Professional Consult (permissible substitution): Payer: Medicaid Other | Admitting: Primary Care
